# Patient Record
Sex: Female | Born: 1935
Health system: Southern US, Community
[De-identification: ages and names within clinical notes are randomized; demographics above are authoritative.]

---

## 2007-08-01 ENCOUNTER — Inpatient Hospital Stay (HOSPITAL_BASED_OUTPATIENT_CLINIC_OR_DEPARTMENT_OTHER): Admission: RE | Admit: 2007-08-01 | Discharge: 2007-08-01 | Payer: Self-pay | Admitting: Cardiology

## 2008-03-30 ENCOUNTER — Emergency Department (HOSPITAL_COMMUNITY): Admission: EM | Admit: 2008-03-30 | Discharge: 2008-03-30 | Payer: Self-pay | Admitting: Emergency Medicine

## 2010-07-07 NOTE — Cardiovascular Report (Signed)
NAMEGABBY, Marcia Saunders              ACCOUNT NO.:  [id number]   MEDICAL RECORD NO.:  [id number]          PATIENT TYPE:  OIB   LOCATION:  1961                         FACILITY:  [facility]   PHYSICIAN:  Mohan N. Sharyn Lull, M.D. DATE OF BIRTH:  11/12/1930   DATE OF PROCEDURE:  08/01/2007  DATE OF DISCHARGE:                            CARDIAC CATHETERIZATION   PROCEDURE:  Left cardiac catheterization with selective left and right  coronary angiography, LV graphy via right groin using Judkins technique.   INDICATIONS FOR THE PROCEDURE:  Ms. Marton is 77 year old black female  with past medical history significant for hypertension, remote history  of tobacco abuse, complaints of vague retrosternal chest fullness  associated with shortness of breath and feeling weak and dizzy, also  complaints of occasional palpitation and lightheadedness.  No syncopal  episode.  Denies PND, orthopnea, and leg swelling.  EKG done in my  office showed normal sinus rhythm with ST-T wave changes in lateral  leads suggestive of ischemia.  The patient denies any rest or nocturnal  anginal chest pain.  Denies chest pain at present.   PAST MEDICAL HISTORY:  As above.   PAST SURGICAL HISTORY:  She had hysterectomy in the past, had partial  thyroidectomy in the past, had tubal ligation in the past.   SOCIAL HISTORY:  She is widowed, retired, worked in Audiological scientist, smoked less  than 1-pack per week for 20+ years, quit in 1980s, used to drink  socially.   FAMILY HISTORY:  Father died at the age of 49 due to epilepsy.  Mother  died of old age at the age of 76.   ALLERGIES:  No known drug allergies.   MEDICATIONS AT HOME:  1. She is on Norvasc 5 mg p.o. daily.  2. Toprol was recently started 250 mg p.o. daily.  3. Enteric-coated aspirin 81 mg p.o. daily.  4. Prilosec 20 mg p.o. daily.   PHYSICAL EXAMINATION:  She is alert, awake, and oriented x3 in no acute  distress.  Blood pressure is 164/94, pulse was 80 and  regular.  Conjunctivae was pink.  Neck was supple.  No JVD, no bruit.  Lungs were  clear to auscultation without rhonchi or rales.  Cardiovascular exam, S1  and S2 was normal.  There was soft systolic murmur and S4 gallop.  Abdomen was soft.  Bowel sounds were present, nontender.  Extremities  revealed no clubbing, cyanosis, or edema.   IMPRESSION:  1. Chest pain with abnormal EKG, rule out coronary insufficiency.  2. Uncontrolled hypertension.  3. Palpitation rule out cardiac arrhythmias.   I discussed with the patient regarding left cath, its risks and benefits  i.e., death, stroke, local vascular complications.  They accept and  consented for the left cath.   PROCEDURE:  After obtaining the informed consent, the patient was  brought to the cath lab and was placed on fluoroscopy table.  Right  groin was prepped and draped in usual fashion.  Xylocaine 1% was used  for local anesthesia in the right groin.  With the help of thin-walled  needle, 4-French arterial sheath was placed.  Sheath was  aspirated and  flushed.  Next, 4-French left Judkins catheter was advanced over the  wire under fluoroscopic guidance up to the ascending aorta.  Wire was  pulled out.  The catheter was aspirated and connected to the manifold.  Catheter was further advanced and engaged into the left coronary ostium.  Multiple views of the left system were taken.  Next, the catheter was  disengaged and was pulled out over the wire and was placed 4-French  right Judkins catheter, which was advanced over the wire under  fluoroscopic guidance up to the ascending aorta.  Wire was pulled out.  The catheter was aspirated and connected to the manifold.  Catheter was  further advanced and engaged into right coronary ostium.  Multiple views  of the right system were taken.  Next, the catheter was disengaged and  was pulled out over the wire and was replaced with 6-French pigtail  catheter which was advanced over the wire  under fluoroscopic guidance to  the ascending aorta.  Wire was pulled out and the catheter was aspirated  and connected to the manifold.  Catheter was further advanced across the  aortic valve into the LV.  LV pressures were recorded.  Next, LV graphy  was done in 30 degrees RAO position.  Post angiographic pressures were  recorded from LV and then pullback pressures were recorded from the  aorta.  There was no gradient across the aortic valve.  Next, the  pigtail catheter was pulled out over the wire.  Sheaths were aspirated  and flushed.   FINDINGS:  LV showed good LV systolic function.  LVH EF of 55-60%.  Left  main was long which was patent.  LAD has 50-60% napkin ring proximal LAD  stenosis and 20-30% mid sequential stenosis.  Diagonal 1 and 2 were very  small which were patent.  Ramus was small which was patent.  Left  circumflex was large which was patent.  OM-1 and OM-2 were small which  were patent.  RCA was patent.  The patient has codominant coronary  system.  The patient tolerated the procedure well.  There were no  complications.  The patient was transferred to recovery room in stable  condition.      Eduardo Osier. Sharyn Lull, M.D.  Electronically Signed     [facility]/MEDQ  D:  08/01/2007  T:  08/01/2007  Job:  [id number]

## 2018-11-22 ENCOUNTER — Other Ambulatory Visit: Payer: Self-pay

## 2018-11-22 ENCOUNTER — Inpatient Hospital Stay (HOSPITAL_COMMUNITY)
Admission: EM | Admit: 2018-11-22 | Discharge: 2018-12-01 | DRG: 377 | Disposition: A | Discharge status: Skilled Nursing Facility | Payer: Medicare Other | Attending: Internal Medicine | Admitting: Internal Medicine

## 2018-11-22 ENCOUNTER — Emergency Department (HOSPITAL_COMMUNITY): Payer: Medicare Other

## 2018-11-22 ENCOUNTER — Inpatient Hospital Stay (HOSPITAL_COMMUNITY): Payer: Medicare Other

## 2018-11-22 DIAGNOSIS — G4733 Obstructive sleep apnea (adult) (pediatric): Secondary | ICD-10-CM

## 2018-11-22 DIAGNOSIS — N2581 Secondary hyperparathyroidism of renal origin: Secondary | ICD-10-CM | POA: Diagnosis present

## 2018-11-22 DIAGNOSIS — D62 Acute posthemorrhagic anemia: Secondary | ICD-10-CM

## 2018-11-22 DIAGNOSIS — E871 Hypo-osmolality and hyponatremia: Secondary | ICD-10-CM | POA: Diagnosis present

## 2018-11-22 DIAGNOSIS — Z6841 Body Mass Index (BMI) 40.0 and over, adult: Secondary | ICD-10-CM

## 2018-11-22 DIAGNOSIS — D649 Anemia, unspecified: Secondary | ICD-10-CM | POA: Insufficient documentation

## 2018-11-22 DIAGNOSIS — E872 Acidosis: Secondary | ICD-10-CM | POA: Diagnosis present

## 2018-11-22 DIAGNOSIS — N179 Acute kidney failure, unspecified: Secondary | ICD-10-CM

## 2018-11-22 DIAGNOSIS — I5082 Biventricular heart failure: Secondary | ICD-10-CM | POA: Diagnosis present

## 2018-11-22 DIAGNOSIS — I2510 Atherosclerotic heart disease of native coronary artery without angina pectoris: Secondary | ICD-10-CM | POA: Diagnosis present

## 2018-11-22 DIAGNOSIS — K921 Melena: Secondary | ICD-10-CM | POA: Diagnosis not present

## 2018-11-22 DIAGNOSIS — I480 Paroxysmal atrial fibrillation: Secondary | ICD-10-CM

## 2018-11-22 DIAGNOSIS — Z20828 Contact with and (suspected) exposure to other viral communicable diseases: Secondary | ICD-10-CM | POA: Diagnosis not present

## 2018-11-22 DIAGNOSIS — I361 Nonrheumatic tricuspid (valve) insufficiency: Secondary | ICD-10-CM | POA: Diagnosis not present

## 2018-11-22 DIAGNOSIS — I50811 Acute right heart failure: Secondary | ICD-10-CM | POA: Diagnosis not present

## 2018-11-22 DIAGNOSIS — I21A1 Myocardial infarction type 2: Secondary | ICD-10-CM | POA: Diagnosis not present

## 2018-11-22 DIAGNOSIS — I219 Acute myocardial infarction, unspecified: Secondary | ICD-10-CM | POA: Diagnosis not present

## 2018-11-22 DIAGNOSIS — R571 Hypovolemic shock: Secondary | ICD-10-CM | POA: Diagnosis present

## 2018-11-22 DIAGNOSIS — R5381 Other malaise: Secondary | ICD-10-CM | POA: Diagnosis not present

## 2018-11-22 DIAGNOSIS — D72829 Elevated white blood cell count, unspecified: Secondary | ICD-10-CM | POA: Diagnosis present

## 2018-11-22 DIAGNOSIS — Z87891 Personal history of nicotine dependence: Secondary | ICD-10-CM

## 2018-11-22 DIAGNOSIS — D689 Coagulation defect, unspecified: Secondary | ICD-10-CM | POA: Diagnosis present

## 2018-11-22 DIAGNOSIS — N170 Acute kidney failure with tubular necrosis: Secondary | ICD-10-CM | POA: Diagnosis not present

## 2018-11-22 DIAGNOSIS — Z79899 Other long term (current) drug therapy: Secondary | ICD-10-CM

## 2018-11-22 DIAGNOSIS — I130 Hypertensive heart and chronic kidney disease with heart failure and stage 1 through stage 4 chronic kidney disease, or unspecified chronic kidney disease: Secondary | ICD-10-CM | POA: Diagnosis present

## 2018-11-22 DIAGNOSIS — R601 Generalized edema: Secondary | ICD-10-CM

## 2018-11-22 DIAGNOSIS — Z515 Encounter for palliative care: Secondary | ICD-10-CM

## 2018-11-22 DIAGNOSIS — I071 Rheumatic tricuspid insufficiency: Secondary | ICD-10-CM | POA: Diagnosis not present

## 2018-11-22 DIAGNOSIS — F0390 Unspecified dementia without behavioral disturbance: Secondary | ICD-10-CM | POA: Diagnosis present

## 2018-11-22 DIAGNOSIS — R778 Other specified abnormalities of plasma proteins: Secondary | ICD-10-CM | POA: Diagnosis not present

## 2018-11-22 DIAGNOSIS — G4710 Hypersomnia, unspecified: Secondary | ICD-10-CM | POA: Diagnosis present

## 2018-11-22 DIAGNOSIS — R578 Other shock: Secondary | ICD-10-CM | POA: Diagnosis present

## 2018-11-22 DIAGNOSIS — Z781 Physical restraint status: Secondary | ICD-10-CM

## 2018-11-22 DIAGNOSIS — E8809 Other disorders of plasma-protein metabolism, not elsewhere classified: Secondary | ICD-10-CM | POA: Diagnosis present

## 2018-11-22 DIAGNOSIS — Z90710 Acquired absence of both cervix and uterus: Secondary | ICD-10-CM

## 2018-11-22 DIAGNOSIS — R0602 Shortness of breath: Secondary | ICD-10-CM

## 2018-11-22 DIAGNOSIS — J90 Pleural effusion, not elsewhere classified: Secondary | ICD-10-CM

## 2018-11-22 DIAGNOSIS — I50810 Right heart failure, unspecified: Secondary | ICD-10-CM | POA: Diagnosis not present

## 2018-11-22 DIAGNOSIS — J9621 Acute and chronic respiratory failure with hypoxia: Secondary | ICD-10-CM | POA: Diagnosis not present

## 2018-11-22 DIAGNOSIS — I469 Cardiac arrest, cause unspecified: Secondary | ICD-10-CM | POA: Diagnosis present

## 2018-11-22 DIAGNOSIS — I468 Cardiac arrest due to other underlying condition: Secondary | ICD-10-CM | POA: Diagnosis present

## 2018-11-22 DIAGNOSIS — G9341 Metabolic encephalopathy: Secondary | ICD-10-CM | POA: Diagnosis not present

## 2018-11-22 DIAGNOSIS — J9602 Acute respiratory failure with hypercapnia: Secondary | ICD-10-CM | POA: Diagnosis not present

## 2018-11-22 DIAGNOSIS — N183 Chronic kidney disease, stage 3 unspecified: Secondary | ICD-10-CM | POA: Diagnosis present

## 2018-11-22 DIAGNOSIS — I50813 Acute on chronic right heart failure: Secondary | ICD-10-CM | POA: Diagnosis not present

## 2018-11-22 DIAGNOSIS — J9601 Acute respiratory failure with hypoxia: Secondary | ICD-10-CM | POA: Diagnosis present

## 2018-11-22 DIAGNOSIS — R0902 Hypoxemia: Secondary | ICD-10-CM

## 2018-11-22 DIAGNOSIS — K922 Gastrointestinal hemorrhage, unspecified: Secondary | ICD-10-CM

## 2018-11-22 DIAGNOSIS — I5031 Acute diastolic (congestive) heart failure: Secondary | ICD-10-CM | POA: Diagnosis not present

## 2018-11-22 DIAGNOSIS — E662 Morbid (severe) obesity with alveolar hypoventilation: Secondary | ICD-10-CM | POA: Diagnosis present

## 2018-11-22 DIAGNOSIS — I2781 Cor pulmonale (chronic): Secondary | ICD-10-CM | POA: Diagnosis not present

## 2018-11-22 DIAGNOSIS — R0689 Other abnormalities of breathing: Secondary | ICD-10-CM | POA: Diagnosis not present

## 2018-11-22 DIAGNOSIS — Z452 Encounter for adjustment and management of vascular access device: Secondary | ICD-10-CM

## 2018-11-22 DIAGNOSIS — G9340 Encephalopathy, unspecified: Secondary | ICD-10-CM | POA: Diagnosis not present

## 2018-11-22 LAB — CBC WITH DIFFERENTIAL/PLATELET
Abs Immature Granulocytes: 0.56 10*3/uL — ABNORMAL HIGH (ref 0.00–0.07)
Basophils Absolute: 0 10*3/uL (ref 0.0–0.1)
Basophils Relative: 0 %
Eosinophils Absolute: 0 10*3/uL (ref 0.0–0.5)
Eosinophils Relative: 0 %
HCT: 12.8 % — ABNORMAL LOW (ref 36.0–46.0)
Hemoglobin: 4 g/dL — CL (ref 12.0–15.0)
Immature Granulocytes: 4 %
Lymphocytes Relative: 11 %
Lymphs Abs: 1.7 10*3/uL (ref 0.7–4.0)
MCH: 25 pg — ABNORMAL LOW (ref 26.0–34.0)
MCHC: 31.3 g/dL (ref 30.0–36.0)
MCV: 80 fL (ref 80.0–100.0)
Monocytes Absolute: 1.5 10*3/uL — ABNORMAL HIGH (ref 0.1–1.0)
Monocytes Relative: 10 %
Neutro Abs: 11.2 10*3/uL — ABNORMAL HIGH (ref 1.7–7.7)
Neutrophils Relative %: 75 %
Platelets: 144 10*3/uL — ABNORMAL LOW (ref 150–400)
RBC: 1.6 MIL/uL — ABNORMAL LOW (ref 3.87–5.11)
RDW: 19.7 % — ABNORMAL HIGH (ref 11.5–15.5)
WBC: 14.9 10*3/uL — ABNORMAL HIGH (ref 4.0–10.5)
nRBC: 0.4 % — ABNORMAL HIGH (ref 0.0–0.2)

## 2018-11-22 LAB — COMPREHENSIVE METABOLIC PANEL
ALT: 77 U/L — ABNORMAL HIGH (ref 0–44)
AST: 151 U/L — ABNORMAL HIGH (ref 15–41)
Albumin: 1.3 g/dL — ABNORMAL LOW (ref 3.5–5.0)
Alkaline Phosphatase: 59 U/L (ref 38–126)
Anion gap: 18 — ABNORMAL HIGH (ref 5–15)
BUN: 27 mg/dL — ABNORMAL HIGH (ref 8–23)
CO2: 17 mmol/L — ABNORMAL LOW (ref 22–32)
Calcium: 7.3 mg/dL — ABNORMAL LOW (ref 8.9–10.3)
Chloride: 97 mmol/L — ABNORMAL LOW (ref 98–111)
Creatinine, Ser: 1.14 mg/dL — ABNORMAL HIGH (ref 0.44–1.00)
GFR calc Af Amer: 50 mL/min — ABNORMAL LOW (ref >60)
GFR calc non Af Amer: 44 mL/min — ABNORMAL LOW (ref >60)
Glucose, Bld: 93 mg/dL (ref 70–99)
Potassium: 4.8 mmol/L (ref 3.5–5.1)
Sodium: 132 mmol/L — ABNORMAL LOW (ref 135–145)
Total Bilirubin: 0.6 mg/dL (ref 0.3–1.2)
Total Protein: 3.3 g/dL — ABNORMAL LOW (ref 6.5–8.1)

## 2018-11-22 LAB — CBG MONITORING, ED: Glucose-Capillary: 91 mg/dL (ref 70–99)

## 2018-11-22 LAB — MASSIVE TRANSFUSION PROTOCOL ORDER (BLOOD BANK NOTIFICATION)

## 2018-11-22 LAB — ABO/RH: ABO/RH(D): A POS

## 2018-11-22 LAB — PROTIME-INR
INR: 2.3 — ABNORMAL HIGH (ref 0.8–1.2)
Prothrombin Time: 24.6 seconds — ABNORMAL HIGH (ref 11.4–15.2)

## 2018-11-22 LAB — BRAIN NATRIURETIC PEPTIDE: B Natriuretic Peptide: 739.2 pg/mL — ABNORMAL HIGH (ref 0.0–100.0)

## 2018-11-22 LAB — LACTIC ACID, PLASMA: Lactic Acid, Venous: >11 mmol/L (ref 0.5–1.9)

## 2018-11-22 LAB — SARS CORONAVIRUS 2 BY RT PCR (HOSPITAL ORDER, PERFORMED IN ~~LOC~~ HOSPITAL LAB): SARS Coronavirus 2: NEGATIVE

## 2018-11-22 LAB — POC OCCULT BLOOD, ED: Fecal Occult Bld: POSITIVE — AB

## 2018-11-22 MED ORDER — FENTANYL CITRATE (PF) 100 MCG/2ML IJ SOLN
25.0000 ug | INTRAMUSCULAR | Status: DC | PRN
  Administered 2018-11-22 – 2018-11-23 (×2): 100 ug via INTRAVENOUS
  Administered 2018-11-23 (×2): 50 ug via INTRAVENOUS
  Filled 2018-11-22 (×4): qty 2

## 2018-11-22 MED ORDER — EPINEPHRINE 1 MG/10ML IJ SOSY
PREFILLED_SYRINGE | INTRAMUSCULAR | Status: AC | PRN
  Administered 2018-11-22: 0.5 mg via INTRAVENOUS

## 2018-11-22 MED ORDER — SODIUM CHLORIDE 0.9 % IV SOLN
8.0000 mg/h | INTRAVENOUS | Status: DC
  Administered 2018-11-23 (×2): 8 mg/h via INTRAVENOUS
  Filled 2018-11-22 (×4): qty 80

## 2018-11-22 MED ORDER — CHLORHEXIDINE GLUCONATE 0.12% ORAL RINSE (MEDLINE KIT)
15.0000 mL | Freq: Two times a day (BID) | OROMUCOSAL | Status: DC
  Administered 2018-11-22 – 2018-11-24 (×5): 15 mL via OROMUCOSAL

## 2018-11-22 MED ORDER — SODIUM CHLORIDE 0.9 % IV SOLN
80.0000 mg | Freq: Once | INTRAVENOUS | Status: DC
  Filled 2018-11-22 (×2): qty 80

## 2018-11-22 MED ORDER — CHLORHEXIDINE GLUCONATE CLOTH 2 % EX PADS
6.0000 | MEDICATED_PAD | Freq: Every day | CUTANEOUS | Status: DC
  Administered 2018-11-25 – 2018-12-01 (×6): 6 via TOPICAL

## 2018-11-22 MED ORDER — FENTANYL CITRATE (PF) 100 MCG/2ML IJ SOLN: 25.0000 ug | INTRAMUSCULAR | Status: DC | PRN

## 2018-11-22 MED ORDER — SODIUM CHLORIDE 0.9 % IV SOLN: INTRAVENOUS | Status: DC | PRN

## 2018-11-22 MED ORDER — SODIUM CHLORIDE 0.9 % IV SOLN: 1.0000 g | Freq: Once | INTRAVENOUS | Status: DC

## 2018-11-22 MED ORDER — SODIUM CHLORIDE 0.9% IV SOLUTION
Freq: Once | INTRAVENOUS | Status: AC
  Administered 2018-11-22: 20:00:00 via INTRAVENOUS

## 2018-11-22 MED ORDER — EPINEPHRINE HCL 5 MG/250ML IV SOLN IN NS
0.5000 ug/min | INTRAVENOUS | Status: DC
  Administered 2018-11-22: 10 ug/min via INTRAVENOUS
  Filled 2018-11-22 (×2): qty 250

## 2018-11-22 MED ORDER — PANTOPRAZOLE SODIUM 40 MG IV SOLR: 40.0000 mg | Freq: Every day | INTRAVENOUS | Status: DC

## 2018-11-22 MED ORDER — ETOMIDATE 2 MG/ML IV SOLN
INTRAVENOUS | Status: AC | PRN
  Administered 2018-11-22: 20 mg via INTRAVENOUS

## 2018-11-22 MED ORDER — FENTANYL CITRATE (PF) 100 MCG/2ML IJ SOLN
INTRAMUSCULAR | Status: AC
  Administered 2018-11-22: 50 ug
  Filled 2018-11-22: qty 2

## 2018-11-22 MED ORDER — PANTOPRAZOLE SODIUM 40 MG IV SOLR: 40.0000 mg | Freq: Two times a day (BID) | INTRAVENOUS | Status: DC

## 2018-11-22 MED ORDER — SODIUM CHLORIDE 0.9 % IV SOLN: INTRAVENOUS | Status: DC

## 2018-11-22 MED ORDER — KETAMINE HCL 50 MG/5ML IJ SOSY
1.0000 mg/kg | PREFILLED_SYRINGE | Freq: Once | INTRAMUSCULAR | Status: AC
  Administered 2018-11-22: 17:00:00 100 mg via INTRAVENOUS
  Filled 2018-11-22 (×2): qty 15

## 2018-11-22 MED ORDER — ORAL CARE MOUTH RINSE
15.0000 mL | OROMUCOSAL | Status: DC
  Administered 2018-11-23 – 2018-11-25 (×28): 15 mL via OROMUCOSAL

## 2018-11-22 MED ORDER — NOREPINEPHRINE 4 MG/250ML-% IV SOLN
0.0000 ug/min | INTRAVENOUS | Status: DC
  Administered 2018-11-22: 17:00:00 5 ug/min via INTRAVENOUS
  Administered 2018-11-22: 17:00:00 10 ug/min via INTRAVENOUS
  Administered 2018-11-23: 2 ug/min via INTRAVENOUS
  Administered 2018-11-23: 5 ug/min via INTRAVENOUS
  Filled 2018-11-22 (×2): qty 250

## 2018-11-22 MED ORDER — MIDAZOLAM HCL 2 MG/2ML IJ SOLN
1.0000 mg | INTRAMUSCULAR | Status: AC | PRN
  Administered 2018-11-23 (×3): 1 mg via INTRAVENOUS
  Filled 2018-11-22 (×2): qty 2

## 2018-11-22 MED ORDER — MIDAZOLAM HCL 2 MG/2ML IJ SOLN
1.0000 mg | INTRAMUSCULAR | Status: DC | PRN
  Administered 2018-11-23: 01:00:00 1 mg via INTRAVENOUS
  Filled 2018-11-22: qty 2

## 2018-11-22 NOTE — Procedures (Signed)
Arterial Catheter Insertion Procedure Note Marcia Saunders [id number] 29-Jun-1930  Procedure: Insertion of Arterial Catheter  Indications: Blood pressure monitoring and Frequent blood sampling  Procedure Details Consent: Risks of procedure as well as the alternatives and risks of each were explained to the (patient/caregiver).  Consent for procedure obtained. Time Out: Verified patient identification, verified procedure, site/side was marked, verified correct patient position, special equipment/implants available, medications/allergies/relevent history reviewed, required imaging and test results available.  Performed  Maximum sterile technique was used including antiseptics, cap, gloves, gown, hand hygiene, mask and sheet. Skin prep: Chlorhexidine; 20 gauge catheter was inserted into left radial artery using the Seldinger technique.  Biopatch and sterile dressing applied.   ULTRASOUND GUIDANCE USED: YES Evaluation Blood flow good; BP tracing positional . Complications: No apparent complications.  Kennieth Rad, MSN, AGACNP-BC [facility] Pgr: [phone] or if no answer [phone] 11/22/2018, 8:08 PM

## 2018-11-22 NOTE — H&P (Addendum)
NAME:  Marcia Saunders, MRN:  [id number], DOB:  [birth year], LOS: 0 ADMISSION DATE:  11/22/2018, CONSULTATION DATE:  11/22/18 REFERRING MD:  Maryan Rued  CHIEF COMPLAINT:  Cardiac Arrest   Brief History   Marcia Saunders is a 85 y.o. female who was admitted 9/30 after cardiac arrest presumed due to hemorrhagic shock from GI bleed of unclear etiology.  History of present illness   Pt is encephelopathic; therefore, this HPI is obtained from chart review. Marcia Saunders is a 85 y.o. female who has unknown PMH as she has not seen a healthcare provider in several years.  She presented to [facility] ED 9/30 after cardiac arrest.  She apparently had not been feeling well for around a month and over the past week or so, began to experience dyspnea, anasarca, weakness.  She also began to experience blood tinged stools starting around 9/28.  On 9/30, she went to use the bathroom and daughter later found her unresponsive on the commode.  Fire was called and upon their arrival, she did have a pulse; however, while on scene, she lost pulses.  She required 2 rounds epi before ROSC.  Upon arrival to ED, she empirically received 2u PRBC's given her appearance and concern for hemorrhagic shock.  She briefly lost pulses again but never required CPR.  She was however started on levophed and epinephrine infusions given persistent hypotension.  Labs from ED were noteable for Hgb of 4 and she received an additional 2u PRBC with 2u FFP ordered.  Per daughter, she did not believe that pt would have wanted to be intubated or resuscitated; however, after consulting with her brothers who are out of state, they have decided to continue current efforts while they make their way to the hospital.  GI has been consulted; however, given her hemodynamic instability, they have opted to defer any invasive procedures for now and continue with supportive care.  If she were to stabilize overnight, EGD / colonoscopy can be considered 10/1.  Past  Medical History  Unknown.  Significant Hospital Events   9/30 > admit.  Consults:  GI.  Procedures:  ETT 9/30 >  CVL pending 9/30 >   Significant Diagnostic Tests:  CXR 9/30 > atelectasis. Echo 10/1 >   Micro Data:  SARS CoV2 9/30 >   Antimicrobials:  Ceftriaxone 9/30 x 1.   Interim history/subjective:  Unresponsive.  Objective:  Blood pressure (!) 141/83, pulse 87, resp. rate (!) 21, height 5\' 6"  (1.676 m), weight 117.9 kg, SpO2 91 %.    Vent Mode: PRVC FiO2 (%):  [100 %] 100 % Set Rate:  [18 bmp] 18 bmp Vt Set:  [470 mL] 470 mL PEEP:  [5 cmH20] 5 cmH20   Intake/Output Summary (Last 24 hours) at 11/22/2018 1831 Last data filed at 11/22/2018 1709 Gross per 24 hour  Intake 9.06 ml  Output -  Net 9.06 ml   Filed Weights   11/22/18 1632  Weight: 117.9 kg    Examination: General: Elderly female, critically ill. Neuro: Unresponsive. HEENT: [place]/AT. Sclerae anicteric.  ETT in place.  Coffee ground emesis in OGT. Cardiovascular: RRR, no M/R/G. + anasarca. Lungs: Respirations even and unlabored.  Diminished bilaterally. Abdomen: BS x 4, soft, NT/ND.  Musculoskeletal: No gross deformities, 2+ edema.  Skin: Intact, warm, no rashes.  Assessment & Plan:   Cardiac arrest - presumed due to hemorrhagic shock in setting GI bleed, unclear etiology.  She is s/p 4u PRBC in ED and 2u FFP currently transfusing. -  No TTM given bleeding. - F/u H/H now and again at midnight. - GI consulted, will plan to see in AM 10/1 and if stabilized, will then consider EGD / colonoscopy. - Continue epinephrine and norepinephrine infusions, wean as able for goal MAP > 65. - Assess CVP's, goal > 8. - Continue PPI gtt. - Assess echo, suspect some degree of underlying CHF. - Trend troponins.  Coagulopathy (INR 2.3) - unclear etiology, ? Underlying liver dysfunction given mild bump in transaminases.  S/p 2u FFP. - Follow coags.  Respiratory insufficiency - due to inability to protect the  airway in the setting of above. - Full vent support. - Assess ABG. - Wean as able. - Bronchial hygiene. - Follow CXR.  AKI. Hyponatremia. AGMA - lactate 2/2 above. - NS @ 100. - Trend lactate. - Follow BMP.   Best Practice:  Diet: NPO. Pain/Anxiety/Delirium protocol (if indicated): Fentanyl PRN / Midazolam PRN.  RASS goal 0. VAP protocol (if indicated): In place. DVT prophylaxis: SCD's only. GI prophylaxis: PPI gtt. Glucose control: SSI if glucose consistently > 180. Mobility: Bedrest. Code Status: Full - 2 sons on way to hospital to discuss further.  Remains full code in the interim. Family Communication: Daughter updated at bedside (Yudith [phone]). Disposition: ICU.  Labs   CBC: Recent Labs  Lab 11/22/18 1640  WBC 14.9*  NEUTROABS 11.2*  HGB 4.0*  HCT 12.8*  MCV 80.0  PLT 144*   Basic Metabolic Panel: Recent Labs  Lab 11/22/18 1640  NA 132*  K 4.8  CL 97*  CO2 17*  GLUCOSE 93  BUN 27*  CREATININE 1.14*  CALCIUM 7.3*   GFR: Estimated Creatinine Clearance: 46.2 mL/min (A) (by C-G formula based on SCr of 1.14 mg/dL (H)). Recent Labs  Lab 11/22/18 1640  WBC 14.9*  LATICACIDVEN >11.0*   Liver Function Tests: Recent Labs  Lab 11/22/18 1640  AST 151*  ALT 77*  ALKPHOS 59  BILITOT 0.6  PROT 3.3*  ALBUMIN 1.3*   No results for input(s): LIPASE, AMYLASE in the last 168 hours. No results for input(s): AMMONIA in the last 168 hours. ABG No results found for: PHART, PCO2ART, PO2ART, HCO3, TCO2, ACIDBASEDEF, O2SAT  Coagulation Profile: Recent Labs  Lab 11/22/18 1640  INR 2.3*   Cardiac Enzymes: No results for input(s): CKTOTAL, CKMB, CKMBINDEX, TROPONINI in the last 168 hours. HbA1C: No results found for: HGBA1C CBG: Recent Labs  Lab 11/22/18 1629  GLUCAP 91    Review of Systems:   Unable to obtain as pt is encephalopathic.  Past medical history  She,  has no past medical history on file.   Surgical History   Unknown.   Social History      Family history   Her family history is not on file.   Allergies Not on File   Home meds  Prior to Admission medications   Not on File    Critical care time: 60 min.    Rutherford Guys, PA [facility] Pager: [phone].  If no answer, [phone] 11/22/2018, 6:31 PM

## 2018-11-22 NOTE — Progress Notes (Signed)
Received  patient from ED. RT unable to zero arterial line draw off line undressed art line it was kinked removed it and held pressure.

## 2018-11-22 NOTE — Progress Notes (Addendum)
[facility] Progress Note Patient Name: Marcia Saunders DOB: [birth year] MRN: [id number]   Date of Service  11/22/2018  HPI/Events of Note  Pt admitted through the ED with cardiac arrest secondary to hemorrhagic shock, she received massive blood transfusion protocol and hemodynamics have stabilized, she is intubated on the ventilator. Post-transfusion hemoglobin is pending.  Pt needs soft wrist restraints.  eICU Interventions  New patient evaluation completed. Will follow up pending labs and Rx accordingly. Restraints ordered.        [name] 11/22/2018, 9:25 PM

## 2018-11-22 NOTE — Procedures (Signed)
Central Venous Catheter Insertion Procedure Note Marcia Saunders [id number] 12-Jun-1930  Procedure: Insertion of Central Venous Catheter Indications: Assessment of intravascular volume and Drug and/or fluid administration  Procedure Details Consent: Risks of procedure as well as the alternatives and risks of each were explained to the (patient/caregiver).  Consent for procedure obtained. Time Out: Verified patient identification, verified procedure, site/side was marked, verified correct patient position, special equipment/implants available, medications/allergies/relevent history reviewed, required imaging and test results available.  Performed  Maximum sterile technique was used including antiseptics, cap, gloves, gown, hand hygiene, mask and sheet. Skin prep: Chlorhexidine; local anesthetic administered A antimicrobial bonded/coated triple lumen catheter was placed in the left internal jugular vein using the Seldinger technique.  Evaluation Blood flow good Complications: No apparent complications Patient did tolerate procedure well. Chest X-ray ordered to verify placement.  CXR: pending.  Otilio Carpen Marcia Saunders 11/22/2018, 8:04 PM

## 2018-11-22 NOTE — Progress Notes (Signed)
[facility] Progress Note Patient Name: GELISA TIEKEN DOB: 19-Jan-1931 MRN: [id number]   Date of Service  11/22/2018  HPI/Events of Note  RN requests check of CXR for central line placement.  eICU Interventions  Central line in good position, no pneumothorax, ETT needs to be pulled back 3 cm.        Kerry Kass Ogan 11/22/2018, 11:30 PM

## 2018-11-22 NOTE — Plan of Care (Signed)
Case discussed with Karen Kays, PA-C, of PCCM as well as Dr. Maryan Rued of ED.  Patient critically ill, unstable, with cardiac arrest in setting of GI bleeding.  Hgb 4.  Hypotensive on pressors.  She had minimal signs of responsiveness based on ED note.  OGT with coffee grounds and non bright red material.  Suggest PPI, blood transfusion, and volume resuscitation via PCCM overnight.  She is in no shape for endoscopy at this point.  We will reassess in AM.

## 2018-11-22 NOTE — Progress Notes (Signed)
Aline insertion attempted X2. RT unable to thread catheter. Per MD we are going to stop all invasive measures per the family at this time.

## 2018-11-22 NOTE — ED Triage Notes (Signed)
Pt arrives via EMS from home with reports of feeling sick for a month, and family found pt unresponsiveness on toilet. Family reports pt has been throwing up blood today. Pt lost pulses and fire started CPR. 2 rounds of epi given, king airway

## 2018-11-22 NOTE — Progress Notes (Signed)
Transported to [place] with no complications.

## 2018-11-22 NOTE — ED Notes (Signed)
Consent for central line obtained 

## 2018-11-22 NOTE — ED Provider Notes (Signed)
[facility] DEPART[facility] Provider Note   CSN: [id number] Arrival date & time:        History   Chief Complaint Chief Complaint  Patient presents with   post cpr    HPI MAKAYLIA HEWETT is a 85 y.o. female.     Patient is an 85 year old female who does not see a doctor regularly and unknown medical history presenting today after a cardiac arrest.  Per patient's daughter who lives with the patient for the last month she has been retaining fluid and becoming weaker and weaker.  In the last 1 week she has not been eating or drinking much and was requiring help to do almost everything.  For the last 2 days she has had bloody stools.  Daughter states earlier this month she was having some shortness of breath but that seemed to be better this week.  She has not had cough, fever or congestion.  The patient's daughter helped her to the bathroom today while she was sitting on the toilet she had a syncopal event.  They called 911.  When fire arrived patient had a palpable pulse and was breathing and then quickly lost her pulse and they immediately started CPR.  When paramedics arrived patient received 2 rounds of epi with return of spontaneous circulation and a King airway was placed.  Patient's blood sugar was greater than 100.  Patient does not take anticoagulation per the daughter.  No history of renal disease.  The history is provided by the EMS personnel and a relative.    No past medical history on file.  There are no active problems to display for this patient.      OB History   No obstetric history on file.      Home Medications    Prior to Admission medications   Not on File    Family History No family history on file.  Social History Social History   Tobacco Use   Smoking status: Not on file  Substance Use Topics   Alcohol use: Not on file   Drug use: Not on file     Allergies   Patient has no allergy information on  record.   Review of Systems Review of Systems  Unable to perform ROS: Intubated  All other systems reviewed and are negative.    Physical Exam Updated Vital Signs BP (!) 141/83    Pulse 87    Resp (!) 21    Wt 117.9 kg    SpO2 91%   Physical Exam Vitals signs and nursing note reviewed.  Constitutional:      General: She is in acute distress.     Appearance: She is well-developed and overweight.     Interventions: She is intubated.     Comments: Mildly gagging with the tube but no other meaningful response  HENT:     Head: Normocephalic and atraumatic.     Mouth/Throat:     Mouth: Mucous membranes are dry.  Eyes:     Comments: Pupils are 4 mm and sluggishly reactive.  Pale conjunctival and oral mucosa  Neck:     Musculoskeletal: Neck supple.  Cardiovascular:     Rate and Rhythm: Regular rhythm. Tachycardia present.     Heart sounds: Normal heart sounds. No murmur. No friction rub.     Comments: Thready pulses in bilateral radius Pulmonary:     Effort: Pulmonary effort is normal. She is intubated.     Breath sounds: Normal  breath sounds. No wheezing or rales.  Abdominal:     General: Bowel sounds are normal. There is distension.     Palpations: Abdomen is soft.     Tenderness: There is no abdominal tenderness. There is no guarding or rebound.     Comments: Anasarca present throughout the body including the abdomen, arms and lower extremities  Genitourinary:    Rectum: Guaiac result positive.     Comments: Bloodstained stool on Hemoccult Musculoskeletal: Normal range of motion.        General: No tenderness.     Right lower leg: Edema present.     Left lower leg: Edema present.     Comments: No edema  Skin:    General: Skin is warm and dry.     Coloration: Skin is pale.     Findings: No rash.  Neurological:     Mental Status: She is unresponsive.     Comments: Patient is gagging on the ET tube but otherwise no significant meaningful response      ED Treatments  / Results  Labs (all labs ordered are listed, but only abnormal results are displayed) Labs Reviewed  CBC WITH DIFFERENTIAL/PLATELET - Abnormal; Notable for the following components:      Result Value   WBC 14.9 (*)    RBC 1.60 (*)    Hemoglobin 4.0 (*)    HCT 12.8 (*)    MCH 25.0 (*)    RDW 19.7 (*)    Platelets 144 (*)    nRBC 0.4 (*)    Neutro Abs 11.2 (*)    Monocytes Absolute 1.5 (*)    Abs Immature Granulocytes 0.56 (*)    All other components within normal limits  COMPREHENSIVE METABOLIC PANEL - Abnormal; Notable for the following components:   Sodium 132 (*)    Chloride 97 (*)    CO2 17 (*)    BUN 27 (*)    Creatinine, Ser 1.14 (*)    Calcium 7.3 (*)    Total Protein 3.3 (*)    Albumin 1.3 (*)    AST 151 (*)    ALT 77 (*)    GFR calc non Af Amer 44 (*)    GFR calc Af Amer 50 (*)    Anion gap 18 (*)    All other components within normal limits  BRAIN NATRIURETIC PEPTIDE - Abnormal; Notable for the following components:   B Natriuretic Peptide 739.2 (*)    All other components within normal limits  PROTIME-INR - Abnormal; Notable for the following components:   Prothrombin Time 24.6 (*)    INR 2.3 (*)    All other components within normal limits  LACTIC ACID, PLASMA - Abnormal; Notable for the following components:   Lactic Acid, Venous >11.0 (*)    All other components within normal limits  POC OCCULT BLOOD, ED - Abnormal; Notable for the following components:   Fecal Occult Bld POSITIVE (*)    All other components within normal limits  CULTURE, BLOOD (ROUTINE X 2)  CULTURE, BLOOD (ROUTINE X 2)  SARS CORONAVIRUS 2 (HOSPITAL ORDER, PERFORMED IN [facility])  DIC (DISSEMINATED INTRAVASCULAR COAGULATION) PANEL  HEMOGLOBIN AND HEMATOCRIT, BLOOD  CBG MONITORING, ED  TYPE AND SCREEN  ABO/RH  MASSIVE TRANSFUSION PROTOCOL ORDER (BLOOD BANK NOTIFICATION)  PREPARE FRESH FROZEN PLASMA  PREPARE PLATELET PHERESIS  PREPARE CRYOPRECIPITATE     EKG None  Radiology Dg Chest Portable 1 View  Result Date: 11/22/2018 CLINICAL DATA:  Acute  cardiac arrest. Intubation. EXAM: PORTABLE CHEST 1 VIEW COMPARISON:  None. FINDINGS: Patient is rotated to the left. Endotracheal tube and nasogastric tube are in appropriate position. Low lung volumes are noted. Atelectasis or infiltrate is seen in the left retrocardiac lung base. Right lung remains clear. No pneumothorax visualized. IMPRESSION: Low lung volumes, with atelectasis or infiltrate in left retrocardiac lung base. Endotracheal tube and nasogastric tube in appropriate position. Electronically Signed   By: Danae OrleansJohn A Stahl M.D.   On: 11/22/2018 17:01    Procedures Procedure Name: Intubation Date/Time: 11/22/2018 6:29 PM Performed by: Gwyneth SproutPlunkett, Zyire Eidson, MD Pre-anesthesia Checklist: Patient identified, Suction available, Patient being monitored and Timeout performed Oxygen Delivery Method: Ambu bag Preoxygenation: Pre-oxygenation with 100% oxygen Induction Type: Rapid sequence Ventilation: Oral airway inserted - appropriate to patient size Laryngoscope Size: Glidescope and 3 Grade View: Grade I Tube size: 7.5 mm Number of attempts: 1 Airway Equipment and Method: Patient positioned with wedge pillow and Video-laryngoscopy Placement Confirmation: ETT inserted through vocal cords under direct vision,  Positive ETCO2 and Breath sounds checked- equal and bilateral Secured at: 21 cm Tube secured with: ETT holder Dental Injury: Teeth and Oropharynx as per pre-operative assessment  Difficulty Due To: Difficulty was unanticipated      (including critical care time)  Medications Ordered in ED Medications  pantoprazole (PROTONIX) 80 mg in sodium chloride 0.9 % 100 mL IVPB (has no administration in time range)  pantoprazole (PROTONIX) 80 mg in sodium chloride 0.9 % 250 mL (0.32 mg/mL) infusion (has no administration in time range)  pantoprazole (PROTONIX) injection 40 mg (has no  administration in time range)  norepinephrine (LEVOPHED) 4mg  in 250mL premix infusion (10 mcg/min Intravenous New Bag/Given 11/22/18 1719)  0.9 %  sodium chloride infusion (has no administration in time range)  EPINEPHrine (ADRENALIN) 4 mg in NS 250 mL (0.016 mg/mL) premix infusion (30 mcg/min Intravenous Rate/Dose Change 11/22/18 1719)  cefTRIAXone (ROCEPHIN) 1 g in sodium chloride 0.9 % 100 mL IVPB (has no administration in time range)  etomidate (AMIDATE) injection (20 mg Intravenous Given 11/22/18 1628)  EPINEPHrine (ADRENALIN) 1 MG/10ML injection (0.5 mg Intravenous Given 11/22/18 1640)  ketamine 50 mg in normal saline 5 mL (10 mg/mL) syringe (100 mg Intravenous Given 11/22/18 1656)     Initial Impression / Assessment and Plan / ED Course  I have reviewed the triage vital signs and the nursing notes.  Pertinent labs & imaging results that were available during my care of the patient were reviewed by me and considered in my medical decision making (see chart for details).        Patient is an 85 year old female being brought in after post arrest.  Patient had return of spontaneous circulation upon arrival heart rate was in the 100s and blood pressure was below 100 systolic.  Patient is diffusely pale with heme positive stools and coffee-ground gastric secretions.  Patient was intubated due to mental status and not protecting her airway.  Intubation as above.  Patient had an OG tube placed.  Given patient's severe paleness and concern for anemia as well as positive stools she was given 2 units of O- blood due to severe hypotension and concern for hemorrhagic shock.  Patient received 2 units of O- blood, 1 L of IV fluids.  Despite the blood patient remained hypotensive and was started on Levophed.  That did not improve her pressure so she was then also started on an epi drip with improvement of blood pressure.  Will start massive transfusion protocol  as speaking with the daughter she currently wants  everything done.  Patient also was placed on a Protonix drip and given a dose of Rocephin.  Chest x-ray shows ET tube in appropriate place, CBC with a hemoglobin of 4 and leukocytosis of 15,000.  CMP with creatinine of 1.14, elevated LFTs and decreased albumin.  Patient's lactate is greater than 11.  INR is 2.3.  BNP is greater than 700.  Critical care consulted and spoke with Dr. Dulce Sellar with GI to make him aware  CRITICAL CARE Performed by: Conny Moening Total critical care time: 45 minutes Critical care time was exclusive of separately billable procedures and treating other patients. Critical care was necessary to treat or prevent imminent or life-threatening deterioration. Critical care was time spent personally by me on the following activities: development of treatment plan with patient and/or surrogate as well as nursing, discussions with consultants, evaluation of patient's response to treatment, examination of patient, obtaining history from patient or surrogate, ordering and performing treatments and interventions, ordering and review of laboratory studies, ordering and review of radiographic studies, pulse oximetry and re-evaluation of patient's condition.   Final Clinical Impressions(s) / ED Diagnoses   Final diagnoses:  Gastrointestinal hemorrhage, unspecified gastrointestinal hemorrhage type  Hemorrhagic shock (HCC)  Anemia, unspecified type    ED Discharge Orders    None       Gwyneth Sprout, MD 11/22/18 [phone]

## 2018-11-23 ENCOUNTER — Inpatient Hospital Stay (HOSPITAL_COMMUNITY): Payer: Medicare Other

## 2018-11-23 DIAGNOSIS — I361 Nonrheumatic tricuspid (valve) insufficiency: Secondary | ICD-10-CM

## 2018-11-23 DIAGNOSIS — D62 Acute posthemorrhagic anemia: Secondary | ICD-10-CM

## 2018-11-23 DIAGNOSIS — I219 Acute myocardial infarction, unspecified: Secondary | ICD-10-CM

## 2018-11-23 DIAGNOSIS — R0689 Other abnormalities of breathing: Secondary | ICD-10-CM

## 2018-11-23 DIAGNOSIS — K921 Melena: Principal | ICD-10-CM

## 2018-11-23 DIAGNOSIS — D689 Coagulation defect, unspecified: Secondary | ICD-10-CM

## 2018-11-23 DIAGNOSIS — I071 Rheumatic tricuspid insufficiency: Secondary | ICD-10-CM

## 2018-11-23 LAB — PREPARE FRESH FROZEN PLASMA
Unit division: 0
Unit division: 0
Unit division: 0
Unit division: 0
Unit division: 0
Unit division: 0

## 2018-11-23 LAB — BPAM FFP
Blood Product Expiration Date: 202010052359
Blood Product Expiration Date: 202010052359
Blood Product Expiration Date: 202010052359
Blood Product Expiration Date: 202010052359
Blood Product Expiration Date: 202010212359
Blood Product Expiration Date: 202010222359
ISSUE DATE / TIME: 202009301822
ISSUE DATE / TIME: 202009301822
ISSUE DATE / TIME: 202009301831
ISSUE DATE / TIME: 202009301831
Unit Type and Rh: 600
Unit Type and Rh: 600
Unit Type and Rh: 6200
Unit Type and Rh: 6200
Unit Type and Rh: 6200
Unit Type and Rh: 6200

## 2018-11-23 LAB — CBC
HCT: 27 % — ABNORMAL LOW (ref 36.0–46.0)
HCT: 26.5 % — ABNORMAL LOW (ref 36.0–46.0)
HCT: 24.2 % — ABNORMAL LOW (ref 36.0–46.0)
HCT: 23 % — ABNORMAL LOW (ref 36.0–46.0)
Hemoglobin: 9.1 g/dL — ABNORMAL LOW (ref 12.0–15.0)
Hemoglobin: 8.9 g/dL — ABNORMAL LOW (ref 12.0–15.0)
Hemoglobin: 8.6 g/dL — ABNORMAL LOW (ref 12.0–15.0)
Hemoglobin: 8.3 g/dL — ABNORMAL LOW (ref 12.0–15.0)
MCH: 27.7 pg (ref 26.0–34.0)
MCH: 27.5 pg (ref 26.0–34.0)
MCH: 28.6 pg (ref 26.0–34.0)
MCH: 29 pg (ref 26.0–34.0)
MCHC: 33.7 g/dL (ref 30.0–36.0)
MCHC: 33.6 g/dL (ref 30.0–36.0)
MCHC: 35.5 g/dL (ref 30.0–36.0)
MCHC: 36.1 g/dL — ABNORMAL HIGH (ref 30.0–36.0)
MCV: 82.3 fL (ref 80.0–100.0)
MCV: 81.8 fL (ref 80.0–100.0)
MCV: 80.4 fL (ref 80.0–100.0)
MCV: 80.4 fL (ref 80.0–100.0)
Platelets: 103 10*3/uL — ABNORMAL LOW (ref 150–400)
Platelets: 106 10*3/uL — ABNORMAL LOW (ref 150–400)
Platelets: 106 10*3/uL — ABNORMAL LOW (ref 150–400)
Platelets: 108 10*3/uL — ABNORMAL LOW (ref 150–400)
RBC: 3.28 MIL/uL — ABNORMAL LOW (ref 3.87–5.11)
RBC: 3.24 MIL/uL — ABNORMAL LOW (ref 3.87–5.11)
RBC: 3.01 MIL/uL — ABNORMAL LOW (ref 3.87–5.11)
RBC: 2.86 MIL/uL — ABNORMAL LOW (ref 3.87–5.11)
RDW: 16.5 % — ABNORMAL HIGH (ref 11.5–15.5)
RDW: 16.7 % — ABNORMAL HIGH (ref 11.5–15.5)
RDW: 17.4 % — ABNORMAL HIGH (ref 11.5–15.5)
RDW: 17.5 % — ABNORMAL HIGH (ref 11.5–15.5)
WBC: 29.6 10*3/uL — ABNORMAL HIGH (ref 4.0–10.5)
WBC: 24.3 10*3/uL — ABNORMAL HIGH (ref 4.0–10.5)
WBC: 36.7 10*3/uL — ABNORMAL HIGH (ref 4.0–10.5)
WBC: 34.1 10*3/uL — ABNORMAL HIGH (ref 4.0–10.5)
nRBC: 0.1 % (ref 0.0–0.2)
nRBC: 0.2 % (ref 0.0–0.2)
nRBC: 0.1 % (ref 0.0–0.2)
nRBC: 0.1 % (ref 0.0–0.2)

## 2018-11-23 LAB — POCT I-STAT 7, (LYTES, BLD GAS, ICA,H+H)
Bicarbonate: 25.4 mmol/L (ref 20.0–28.0)
Calcium, Ion: 1.08 mmol/L — ABNORMAL LOW (ref 1.15–1.40)
HCT: 25 % — ABNORMAL LOW (ref 36.0–46.0)
Hemoglobin: 8.5 g/dL — ABNORMAL LOW (ref 12.0–15.0)
O2 Saturation: 100 %
Potassium: 4.3 mmol/L (ref 3.5–5.1)
Sodium: 131 mmol/L — ABNORMAL LOW (ref 135–145)
TCO2: 27 mmol/L (ref 22–32)
pCO2 arterial: 42.8 mmHg (ref 32.0–48.0)
pH, Arterial: 7.381 (ref 7.350–7.450)
pO2, Arterial: 203 mmHg — ABNORMAL HIGH (ref 83.0–108.0)

## 2018-11-23 LAB — TROPONIN I (HIGH SENSITIVITY)
Troponin I (High Sensitivity): 1721 ng/L (ref <18)
Troponin I (High Sensitivity): 2378 ng/L (ref <18)
Troponin I (High Sensitivity): 4017 ng/L (ref <18)
Troponin I (High Sensitivity): 3844 ng/L (ref <18)
Troponin I (High Sensitivity): 3802 ng/L (ref <18)

## 2018-11-23 LAB — DIC (DISSEMINATED INTRAVASCULAR COAGULATION)PANEL
D-Dimer, Quant: 5.03 ug/mL-FEU — ABNORMAL HIGH (ref 0.00–0.50)
Fibrinogen: 192 mg/dL — ABNORMAL LOW (ref 210–475)
INR: 1.7 — ABNORMAL HIGH (ref 0.8–1.2)
Platelets: 103 10*3/uL — ABNORMAL LOW (ref 150–400)
Prothrombin Time: 19.6 seconds — ABNORMAL HIGH (ref 11.4–15.2)
Smear Review: NONE SEEN
aPTT: 30 seconds (ref 24–36)

## 2018-11-23 LAB — LACTIC ACID, PLASMA
Lactic Acid, Venous: 3.7 mmol/L (ref 0.5–1.9)
Lactic Acid, Venous: 2.4 mmol/L (ref 0.5–1.9)
Lactic Acid, Venous: 2 mmol/L (ref 0.5–1.9)

## 2018-11-23 LAB — PROTIME-INR
INR: 1.6 — ABNORMAL HIGH (ref 0.8–1.2)
Prothrombin Time: 19 seconds — ABNORMAL HIGH (ref 11.4–15.2)

## 2018-11-23 LAB — BASIC METABOLIC PANEL
Anion gap: 8 (ref 5–15)
BUN: 34 mg/dL — ABNORMAL HIGH (ref 8–23)
CO2: 25 mmol/L (ref 22–32)
Calcium: 7.2 mg/dL — ABNORMAL LOW (ref 8.9–10.3)
Chloride: 98 mmol/L (ref 98–111)
Creatinine, Ser: 1.28 mg/dL — ABNORMAL HIGH (ref 0.44–1.00)
GFR calc Af Amer: 44 mL/min — ABNORMAL LOW (ref >60)
GFR calc non Af Amer: 38 mL/min — ABNORMAL LOW (ref >60)
Glucose, Bld: 126 mg/dL — ABNORMAL HIGH (ref 70–99)
Potassium: 4.5 mmol/L (ref 3.5–5.1)
Sodium: 131 mmol/L — ABNORMAL LOW (ref 135–145)

## 2018-11-23 LAB — BLOOD PRODUCT ORDER (VERBAL) VERIFICATION

## 2018-11-23 LAB — ECHOCARDIOGRAM COMPLETE
Height: 66 in
Weight: 4035.3 oz

## 2018-11-23 LAB — MAGNESIUM: Magnesium: 1.5 mg/dL — ABNORMAL LOW (ref 1.7–2.4)

## 2018-11-23 LAB — PHOSPHORUS: Phosphorus: 4.8 mg/dL — ABNORMAL HIGH (ref 2.5–4.6)

## 2018-11-23 LAB — MRSA PCR SCREENING: MRSA by PCR: NEGATIVE

## 2018-11-23 MED ORDER — MAGNESIUM SULFATE 2 GM/50ML IV SOLN
2.0000 g | Freq: Once | INTRAVENOUS | Status: AC
  Administered 2018-11-23: 10:00:00 2 g via INTRAVENOUS
  Filled 2018-11-23: qty 50

## 2018-11-23 MED ORDER — SODIUM CHLORIDE 0.9 % IV SOLN
2.0000 g | INTRAVENOUS | Status: AC
  Administered 2018-11-23 – 2018-11-27 (×5): 2 g via INTRAVENOUS
  Filled 2018-11-23 (×2): qty 20
  Filled 2018-11-23 (×3): qty 2

## 2018-11-23 MED FILL — Medication: Qty: 1 | Status: AC

## 2018-11-23 NOTE — Consult Note (Signed)
Cardiology Consultation:   Patient ID: Marcia Saunders MRN: [id number]; DOB: [birth year]  Admit date: 11/22/2018 Date of Consult: 11/23/2018  Primary Care Provider: Patient, No Pcp Per Primary Cardiologist: No primary care provider on file.  Primary Electrophysiologist:  None    Patient Profile:   Marcia Saunders is a 85 y.o. female with a hx of moderate CAD and HTN who is being seen today for the evaluation of abnormal cardiac enzymes in the setting of hemorrhagic shock at the request of Dr. Loanne Drilling.  History of Present Illness:   Marcia Saunders he is intubated and sedated and unable to provide history, which is obtained from her family and the chart.  The medical providers in many years and her past medical history is largely unknown.  She apparently had PEA arrest in the setting of hematochezia and suspected hemorrhagic shock, probably related to lower GI bleed.  She was found unconscious on the commode by her daughter.  Although EMS initially detected a pulse she subsequent became pulseless but recovered spontaneous circulation after administration of intravenous epinephrine.  Initial hemoglobin was 4.  He was coagulopathic with an INR of 2.3, cause uncertain.  She requires pressors.  She is receiving PRBC and FFP transfusions.  Before these events occurred she had been experiencing dyspnea, generalized swelling and weakness for at least a month.  Her granddaughter, Apolonio Schneiders, reports that she has witnessed her grandmother having apnea when she sleeps.  She is a very loud snorer.  She has some daytime hypersomnolence.    Past medical and surgical history  We have very limited knowledge about her past medical or surgical history, but there is a record of a cardiac catheterization performed in 2009. This showed normal left ventricular systolic function with EF 55 to 60% and scattered nonobstructive stenoses (proximal LAD 50-60%, mid LAD 20-30%, patent circumflex and RCA systems). The cath  report mentions that she had "uncontrolled hypertension"  Home Medications:  Prior to Admission medications   Not on File    Inpatient Medications: Scheduled Meds:  chlorhexidine gluconate (MEDLINE KIT)  15 mL Mouth Rinse BID   Chlorhexidine Gluconate Cloth  6 each Topical Daily   mouth rinse  15 mL Mouth Rinse 10 times per day   [START ON 11/26/2018] pantoprazole  40 mg Intravenous Q12H   Continuous Infusions:  sodium chloride     cefTRIAXone (ROCEPHIN)  IV Stopped (11/23/18 1000)   norepinephrine (LEVOPHED) Adult infusion 5 mcg/min (11/23/18 1636)   pantoprazole (PROTONIX) IVPB     pantoprozole (PROTONIX) infusion 8 mg/hr (11/23/18 1300)   PRN Meds: Place/Maintain arterial line **AND** sodium chloride, fentaNYL (SUBLIMAZE) injection, fentaNYL (SUBLIMAZE) injection, midazolam  Allergies:   No Known Allergies  Social History:    History of smoking <1 pack a week for 20+ years, quit in the 1980s.  She is widowed retired Hotel manager.  Social History   Socioeconomic History   Marital status: Widowed    Spouse name: Not on file   Number of children: Not on file   Years of education: Not on file   Highest education level: Not on file  Occupational History   Not on file  Social Needs   Financial resource strain: Not on file   Food insecurity    Worry: Not on file    Inability: Not on file   Transportation needs    Medical: Not on file    Non-medical: Not on file  Tobacco Use   Smoking status: Not on file  Substance and Sexual Activity   Alcohol use: Not on file   Drug use: Not on file   Sexual activity: Not on file  Lifestyle   Physical activity    Days per week: Not on file    Minutes per session: Not on file   Stress: Not on file  Relationships   Social connections    Talks on phone: Not on file    Gets together: Not on file    Attends religious service: Not on file    Active member of club or organization: Not on file    Attends  meetings of clubs or organizations: Not on file    Relationship status: Not on file   Intimate partner violence    Fear of current or ex partner: Not on file    Emotionally abused: Not on file    Physically abused: Not on file    Forced sexual activity: Not on file  Other Topics Concern   Not on file  Social History Narrative   Not on file    Family History:   Reportedly significant for epilepsy causing the death of her father at age 8.  Her mother died of old age at 59.  ROS:  Please see the history of present illness.  Due to patient's condition her detailed review of systems could not be obtained  Physical Exam/Data:   Vitals:   11/23/18 1400 11/23/18 1445 11/23/18 1503 11/23/18 1553  BP: (!) 86/55 132/75 (!) 120/51   Pulse:   88   Resp: 18  19   Temp:    98.1 F (36.7 C)  TempSrc:    Oral  SpO2: (!) 84%     Weight:      Height:        Intake/Output Summary (Last 24 hours) at 11/23/2018 1713 Last data filed at 11/23/2018 1400 Gross per 24 hour  Intake 877.42 ml  Output 55 ml  Net 822.42 ml   Last 3 Weights 11/23/2018 11/22/2018 11/22/2018  Weight (lbs) 252 lb 3.3 oz 250 lb 260 lb  Weight (kg) 114.4 kg 113.4 kg 117.935 kg     Body mass index is 40.71 kg/m.  General: Morbidly obese, well developed, sedated, mechanically ventilated.  Does not respond, but was agitated earlier before sedation HEENT: normal Lymph: no adenopathy Neck: Prominent V waves to the angle of the jaw Endocrine:  No thryomegaly Vascular: No carotid bruits; FA pulses 2+ bilaterally without bruits  Cardiac:  normal S1, S2; RRR; 2/6 holosystolic mid precordial murmur, no diastolic murmurs, gallops or pericardial rubs heard Lungs:  clear to auscultation bilaterally, no wheezing, rhonchi or rales  Abd: Mildly distended, probable ascites but also obesity, nontender, unable to palpate the spleen or the liver Ext: Diffuse 3+ pitting edema involving all 4 extremities and the abdominal  wall Musculoskeletal:  No deformities Skin: warm and dry  Neuro: Sedated, unable to assess Psych: Unable to assess  EKG:  The EKG was personally reviewed and demonstrates:  Extremely reduced voltage throughout, but suspect NSR w PACs, not atrial fibrillation  Telemetry:  Telemetry was personally reviewed and demonstrates: Sinus rhythm with PACs  Relevant CV Studies: Echocardiogram 11/23/2018  1. Left ventricular ejection fraction, by visual estimation, is 60 to 65%. The left ventricle has normal function. Normal left ventricular size. There is moderately increased left ventricular hypertrophy.  2. Global right ventricle has mildly reduced systolic function.The right ventricular size is mildly enlarged. No increase in right ventricular wall thickness.  3.  Left atrial size was moderately dilated.  4. Right atrial size was severely dilated.  5. The mitral valve is normal in structure. Trace mitral valve regurgitation. No evidence of mitral stenosis.  6. The tricuspid valve is normal in structure. Tricuspid valve regurgitation severe.  7. The aortic valve is tricuspid Aortic valve regurgitation is trivial by color flow Doppler. Mild to moderate aortic valve sclerosis/calcification without any evidence of aortic stenosis.  8. The pulmonic valve was not well visualized. Pulmonic valve regurgitation was not assessed by color flow Doppler.  9. Mildly elevated pulmonary artery systolic pressure. 10. The inferior vena cava is normal in size with greater than 50% respiratory variability, suggesting right atrial pressure of 3 mmHg. 11. The interatrial septum was not well visualized.  CARDIAC CATH 08/01/2007  FINDINGS:  LV showed good LV systolic function.  LVH EF of 55-60%.  Left  main was long which was patent.  LAD has 50-60% napkin ring proximal LAD  stenosis and 20-30% mid sequential stenosis.  Diagonal 1 and 2 were very  small which were patent.  Ramus was small which was patent.  Left   circumflex was large which was patent.  OM-1 and OM-2 were small which  were patent.  RCA was patent.  The patient has codominant coronary  system.  The patient tolerated the procedure well.  There were no  complications.  The patient was transferred to recovery room in stable  condition.   Laboratory Data:  High Sensitivity Troponin:   Recent Labs  Lab 11/23/18 0007 11/23/18 0421 11/23/18 1255  TROPONINIHS 1,721* 2,378* 4,017*     Chemistry Recent Labs  Lab 11/22/18 1640 11/23/18 0344 11/23/18 0421  NA 132* 131* 131*  K 4.8 4.3 4.5  CL 97*  --  98  CO2 17*  --  25  GLUCOSE 93  --  126*  BUN 27*  --  34*  CREATININE 1.14*  --  1.28*  CALCIUM 7.3*  --  7.2*  GFRNONAA 44*  --  38*  GFRAA 50*  --  44*  ANIONGAP 18*  --  8    Recent Labs  Lab 11/22/18 1640  PROT 3.3*  ALBUMIN 1.3*  AST 151*  ALT 77*  ALKPHOS 59  BILITOT 0.6   Hematology Recent Labs  Lab 11/23/18 0007 11/23/18 0344 11/23/18 0421 11/23/18 1255  WBC 29.6*  --  24.3* 36.7*  RBC 3.28*  --  3.24* 3.01*  HGB 9.1* 8.5* 8.9* 8.6*  HCT 27.0* 25.0* 26.5* 24.2*  MCV 82.3  --  81.8 80.4  MCH 27.7  --  27.5 28.6  MCHC 33.7  --  33.6 35.5  RDW 16.5*  --  16.7* 17.4*  PLT 103*   103*  --  106* 106*   BNP Recent Labs  Lab 11/22/18 1640  BNP 739.2*    DDimer  Recent Labs  Lab 11/23/18 0007  DDIMER 5.03*     Radiology/Studies:  Ct Abdomen Pelvis Wo Contrast  Result Date: 11/23/2018 CLINICAL DATA:  Abdominal distension, history of rectal bleeding and hypotension. EXAM: CT ABDOMEN AND PELVIS WITHOUT CONTRAST TECHNIQUE: Multidetector CT imaging of the abdomen and pelvis was performed following the standard protocol without IV contrast. COMPARISON:  None. FINDINGS: Lower chest: Dense basilar airspace disease and small bilateral pleural effusions. Central venous access device terminates at the caval to atrial junction. Heart size markedly enlarged. Hepatobiliary: Low-density area along the anterior  hepatic margin in the intralobar fissure likely represents a cyst. Assessment is  markedly limited however given lack of intravenous contrast and secondary to patient body habitus and arm position. Sludge is seen within the gallbladder. No overt biliary ductal dilation. Pancreas: Unremarkable. No pancreatic ductal dilatation or surrounding inflammatory changes. Spleen: Limited assessment of the spleen secondary to beam hardening artifact from arm position. Adrenals/Urinary Tract: Adrenal glands are normal. No signs of hydronephrosis.  Renal contours are normal. Stomach/Bowel: Enteric tube in place within the stomach. No signs of acute gastrointestinal process. The appendix is not visualized, no secondary signs to suggest appendicitis. No signs of pneumatosis or bowel obstruction. Vascular/Lymphatic: Moderate calcific atherosclerosis. Vessels not well assessed without contrast. No signs of retroperitoneal adenopathy. No signs of pelvic adenopathy. Reproductive: Post hysterectomy. Other: Diffuse intra-abdominal ascites. Density measurements of fluid are hampered by technical factors secondary to body habitus and noise on the current exam. Extensive body wall edema. Musculoskeletal: Spinal degenerative change without acute or destructive bone process. IMPRESSION: 1. Anasarca. 2. Area of low attenuation along the anterior liver margin may represent a cyst or loculated fluid, this is not clear on the current exam which is markedly limited by body habitus and technical factors. 3. Basilar airspace disease may represent pneumonia or dependent consolidation associated with effusions. 4. Marked cardiomegaly. Electronically Signed   By: Zetta Bills M.D.   On: 11/23/2018 15:12   Dg Chest Port 1 View  Result Date: 11/23/2018 CLINICAL DATA:  Respiratory failure. EXAM: PORTABLE CHEST 1 VIEW COMPARISON:  Radiographs of November 22, 2018. FINDINGS: Stable cardiomegaly. Endotracheal and nasogastric tubes are in grossly good  position. Left internal jugular catheter is unchanged in position. No pneumothorax is noted. Mild left pleural effusion is noted with associated atelectasis or infiltrate. Minimal right basilar subsegmental atelectasis is noted. Bony thorax is unremarkable. IMPRESSION: Support apparatus in grossly good position. Stable left basilar opacity as described above. Stable minimal right basilar opacity as well. Electronically Signed   By: Marijo Conception M.D.   On: 11/23/2018 08:52   Dg Chest Port 1 View  Result Date: 11/22/2018 CLINICAL DATA:  85 year old female with central line placement. EXAM: PORTABLE CHEST 1 VIEW COMPARISON:  Earlier radiograph dated 11/22/2018 FINDINGS: Interval placement of a left IJ central venous line with tip at the cavoatrial junction. No pneumothorax. Endotracheal tube with tip at the level of the carina tilting towards the right mainstem bronchus. Recommend retraction by approximately 3 cm for optimal positioning. Enteric tube extends below the diaphragm with tip beyond the inferior margin of the image. No interval change in the appearance of the lungs or cardiomediastinal silhouette since the earlier radiograph. IMPRESSION: 1. Left IJ central venous line with tip at the cavoatrial junction. No pneumothorax. 2. Endotracheal tube with tip at the level of the carina tilting towards the right mainstem bronchus. Recommend retraction by 3 cm for optimal positioning. Electronically Signed   By: Anner Crete M.D.   On: 11/22/2018 21:22   Dg Chest Portable 1 View  Result Date: 11/22/2018 CLINICAL DATA:  Acute cardiac arrest. Intubation. EXAM: PORTABLE CHEST 1 VIEW COMPARISON:  None. FINDINGS: Patient is rotated to the left. Endotracheal tube and nasogastric tube are in appropriate position. Low lung volumes are noted. Atelectasis or infiltrate is seen in the left retrocardiac lung base. Right lung remains clear. No pneumothorax visualized. IMPRESSION: Low lung volumes, with atelectasis or  infiltrate in left retrocardiac lung base. Endotracheal tube and nasogastric tube in appropriate position. Electronically Signed   By: Marlaine Hind M.D.   On: 11/22/2018 17:01  Assessment and Plan:   1. Post hemorrhagic shock: Hemodynamics are improving but she is still pressor dependent.  Hemoglobin up to 8.6.  Lactic acid trending down.  Very poor urine output, high concern for post shock ATN in the next few days.  2. Demand ischemia:  Suspect demand ischemia/injury in the setting of critical anemia and shock. ECG does not support a diagnosis of major acute coronary event and there are no LV wall motion abnormalities on echo. 3. Right heart failure/anasarca: Hypoalbuminemia is compounding the swelling, but high level of suspicion for sleep disordered breathing and/or obesity-hypoventilation syndrome.  Severe right atrial enlargement suggests chronicity.  Not sure what to make of the elevated d-dimer, but she is clearly not a candidate for anticoagulation.  There is (at least) mild PAH and severe TR (suspect secondary). Marked discrepancy between LVH on echo and low voltage on ECG raises possibility of amyloidosis, but the RV appearance is atypical for that diagnosis and Doppler findings are not really consistent with restrictive cardiomyopathy. Low voltage may be explained by anasarca and obesity.  4. Atrial fibrillation: questionable diagnosis. I think P waves are seen on both ECGs and PACs make the rhythm irregular. During the echo there is distinct atrial contraction c/w sinus rhythm.  Obviously she cannot be anticoagulated. 5. Coagulopathy: Etiology uncertain.  Consider liver dysfunction due to passive congestion, although CT does not show evidence of cirrhosis.  Note severe hypoalbuminemia.    CRITICAL CARE Performed by: Dani Gobble Dilan Fullenwider   Total critical care time: 45 minutes  Critical care time was exclusive of separately billable procedures and treating other patients.  Critical care was  necessary to treat or prevent imminent or life-threatening deterioration.  Critical care was time spent personally by me on the following activities: development of treatment plan with patient and/or surrogate as well as nursing, discussions with consultants, evaluation of patient's response to treatment, examination of patient, obtaining history from patient or surrogate, ordering and performing treatments and interventions, ordering and review of laboratory studies, ordering and review of radiographic studies, pulse oximetry and re-evaluation of patient's condition.   For questions or updates, please contact [facility] Please consult [email or web address] for contact info under     Signed, Sanda Klein, MD  11/23/2018 5:13 PM

## 2018-11-23 NOTE — Progress Notes (Signed)
A-line attempts X2  unsuccessful, able to get adequate blood return but was unable to thread catheter into arterial lumen. RN aware. No hematoma noted

## 2018-11-23 NOTE — Progress Notes (Signed)
CRITICAL VALUE ALERT  Critical Value:  Troponin 1721 and lactic acid 3.7   Date & Time Notied:  11/23/2018 0230  Provider Notified: Dr Lucile Shutters  Orders Received/Actions taken: No new orders

## 2018-11-23 NOTE — Progress Notes (Signed)
RT transported patient to CT and returned to [facility]. No complications. RT will continue to monitor.

## 2018-11-23 NOTE — Progress Notes (Signed)
  Echocardiogram 2D Echocardiogram has been performed.  Loretto Belinsky G Sarahmarie Leavey 11/23/2018, 2:04 PM

## 2018-11-23 NOTE — Progress Notes (Signed)
Initial Nutrition Assessment  DOCUMENTATION CODES:   Obesity unspecified  INTERVENTION:   Tube feeding: -Vital High Protein @ 20 ml/hr via OGT -Increase by 10 ml Q4 hours to goal rate of 60 ml/hr (1440 ml)  At goal TF provides: 1440 kcals, 126 grams protein, 1204 ml free water. Meets 100% of needs.   NUTRITION DIAGNOSIS:   Inadequate oral intake related to inability to eat as evidenced by NPO status.  GOAL:   Patient will meet greater than or equal to 90% of their needs  MONITOR:   TF tolerance, Weight trends, Skin, Labs, Vent status, Diet advancement, I & O's  REASON FOR ASSESSMENT:   Ventilator    ASSESSMENT:   Patient with unknown PMH. Presents this admission with cardiac arrest due to presumed hemorrhagic shock from GI bleed.   Pt discussed during ICU rounds and with RN.   TTM deferred given bleed. Pressors being weaned. Requiring PRN sedation. Abdomen shows to be distended- GI suspects lower GI bleed. Plan for endoscopy. Family [place] the way from out of state to discuss code status. Will provide TF for when medically feasible.   Admission weight: 117.9 kg  Current weight: 114.4 kg  (no previous weight history recorded in chart)  Patient is currently intubated on ventilator support MV: 10.6  L/min Temp (24hrs), Avg:97.9 F (36.6 C), Min:97.6 F (36.4 C), Max:98.2 F (36.8 C)   I/O: +436 ml since admit UOP: 35 ml x 24 hrs   Drips: levophed Labs: Na 131 (L) CBG 93-126    NUTRITION - FOCUSED PHYSICAL EXAM:    Most Recent Value  Orbital Region  No depletion  Upper Arm Region  No depletion  Thoracic and Lumbar Region  Unable to assess  Buccal Region  No depletion  Temple Region  No depletion  Clavicle Bone Region  No depletion  Clavicle and Acromion Bone Region  No depletion  Scapular Bone Region  Unable to assess  Dorsal Hand  No depletion  Patellar Region  No depletion  Anterior Thigh Region  No depletion  Posterior Calf Region  No depletion   Edema (RD Assessment)  Severe  Hair  Reviewed  Eyes  Unable to assess  Mouth  Unable to assess  Skin  Reviewed  Nails  Reviewed     Diet Order:   Diet Order            Diet NPO time specified  Diet effective now              EDUCATION NEEDS:   Not appropriate for education at this time  Skin:  Skin Assessment: Skin Integrity Issues: Skin Integrity Issues:: Other (Comment) Other: MASD- groin  Last BM:  9/30  Height:   Ht Readings from Last 1 Encounters:  11/22/18 5\' 6"  (1.676 m)    Weight:   Wt Readings from Last 1 Encounters:  11/23/18 114.4 kg    Ideal Body Weight:  59.1 kg  BMI:  Body mass index is 40.71 kg/m.  Estimated Nutritional Needs:   Kcal:  1247-1588 kcal  Protein:  118-148 grams  Fluid:  >/= 1.5 L/day   Mariana Single RD, LDN Clinical Nutrition Pager # - [phone]

## 2018-11-23 NOTE — Consult Note (Signed)
Referring Provider: Dr. Loanne Drilling Primary Care Physician:  Patient, No Pcp Per Primary Gastroenterologist:  Althia Forts  Reason for Consultation:  GI bleed  HPI: Marcia Saunders is a 85 y.o. female admitted for cardiac arrest with presumed hemorrhagic shock with report of rectal bleeding over the last couple of days. Hgb 4 on admit and transfused 4 U PRBCs to 9.1. INR 2.3 on admit and following 2 U FFP, INR 1.6. Lactic acid greater than 11 on admit. Patient's pressors being weaned and being weaned off the ventilator. History obtained by chart review. Nurse reports one maroon stool overnight. No history of peptic ulcer disease. No known history of liver disease.  No past medical history on file.  PMH unable to obtain (unknown per chart)  Prior to Admission medications   Not on File    Scheduled Meds: . chlorhexidine gluconate (MEDLINE KIT)  15 mL Mouth Rinse BID  . Chlorhexidine Gluconate Cloth  6 each Topical Daily  . mouth rinse  15 mL Mouth Rinse 10 times per day  . [START ON 11/26/2018] pantoprazole  40 mg Intravenous Q12H   Continuous Infusions: . sodium chloride    . cefTRIAXone (ROCEPHIN)  IV    . magnesium sulfate bolus IVPB    . norepinephrine (LEVOPHED) Adult infusion 3 mcg/min (11/23/18 0700)  . pantoprazole (PROTONIX) IVPB    . pantoprozole (PROTONIX) infusion 8 mg/hr (11/23/18 0700)   PRN Meds:.Place/Maintain arterial line **AND** sodium chloride, fentaNYL (SUBLIMAZE) injection, fentaNYL (SUBLIMAZE) injection, midazolam  Allergies as of 11/22/2018  . (No Known Allergies)    No family history on file.  Social History   Socioeconomic History  . Marital status: Widowed    Spouse name: Not on file  . Number of children: Not on file  . Years of education: Not on file  . Highest education level: Not on file  Occupational History  . Not on file  Social Needs  . Financial resource strain: Not on file  . Food insecurity    Worry: Not on file    Inability: Not on  file  . Transportation needs    Medical: Not on file    Non-medical: Not on file  Tobacco Use  . Smoking status: Not on file  Substance and Sexual Activity  . Alcohol use: Not on file  . Drug use: Not on file  . Sexual activity: Not on file  Lifestyle  . Physical activity    Days per week: Not on file    Minutes per session: Not on file  . Stress: Not on file  Relationships  . Social Herbalist on phone: Not on file    Gets together: Not on file    Attends religious service: Not on file    Active member of club or organization: Not on file    Attends meetings of clubs or organizations: Not on file    Relationship status: Not on file  . Intimate partner violence    Fear of current or ex partner: Not on file    Emotionally abused: Not on file    Physically abused: Not on file    Forced sexual activity: Not on file  Other Topics Concern  . Not on file  Social History Narrative  . Not on file    Review of Systems: Unable to obtain  Physical Exam: Vital signs: Vitals:   11/23/18 0833 11/23/18 0857  BP:  120/76  Pulse:  87  Resp:    Temp: 98.2  F (36.8 C)   SpO2:  98%     General:   Intubated, elderly, obese, mild acute distress  Head: normocephalic, atraumatic Eyes: anicteric sclera ENT: oropharynx clear Neck: supple, nontender Lungs:  Coarse breath sounds anteriorly Heart:  Regular rate and rhythm; no murmurs, clicks, rubs,  or gallops. Abdomen: Distended, tender (facial grimace) periumbilically, obese, decreased bowel sounds  Rectal:  Deferred Ext: +LE edema  GI:  Lab Results: Recent Labs    11/22/18 1640 11/23/18 0007 11/23/18 0344 11/23/18 0421  WBC 14.9* 29.6*  --  24.3*  HGB 4.0* 9.1* 8.5* 8.9*  HCT 12.8* 27.0* 25.0* 26.5*  PLT 144* 103*  103*  --  106*   BMET Recent Labs    11/22/18 1640 11/23/18 0344 11/23/18 0421  NA 132* 131* 131*  K 4.8 4.3 4.5  CL 97*  --  98  CO2 17*  --  25  GLUCOSE 93  --  126*  BUN 27*  --  34*   CREATININE 1.14*  --  1.28*  CALCIUM 7.3*  --  7.2*   LFT Recent Labs    11/22/18 1640  PROT 3.3*  ALBUMIN 1.3*  AST 151*  ALT 77*  ALKPHOS 59  BILITOT 0.6   PT/INR Recent Labs    11/23/18 0007 11/23/18 0421  LABPROT 19.6* 19.0*  INR 1.7* 1.6*     Studies/Results: Dg Chest Port 1 View  Result Date: 11/23/2018 CLINICAL DATA:  Respiratory failure. EXAM: PORTABLE CHEST 1 VIEW COMPARISON:  Radiographs of November 22, 2018. FINDINGS: Stable cardiomegaly. Endotracheal and nasogastric tubes are in grossly good position. Left internal jugular catheter is unchanged in position. No pneumothorax is noted. Mild left pleural effusion is noted with associated atelectasis or infiltrate. Minimal right basilar subsegmental atelectasis is noted. Bony thorax is unremarkable. IMPRESSION: Support apparatus in grossly good position. Stable left basilar opacity as described above. Stable minimal right basilar opacity as well. Electronically Signed   By: Marijo Conception M.D.   On: 11/23/2018 08:52   Dg Chest Port 1 View  Result Date: 11/22/2018 CLINICAL DATA:  85 year old female with central line placement. EXAM: PORTABLE CHEST 1 VIEW COMPARISON:  Earlier radiograph dated 11/22/2018 FINDINGS: Interval placement of a left IJ central venous line with tip at the cavoatrial junction. No pneumothorax. Endotracheal tube with tip at the level of the carina tilting towards the right mainstem bronchus. Recommend retraction by approximately 3 cm for optimal positioning. Enteric tube extends below the diaphragm with tip beyond the inferior margin of the image. No interval change in the appearance of the lungs or cardiomediastinal silhouette since the earlier radiograph. IMPRESSION: 1. Left IJ central venous line with tip at the cavoatrial junction. No pneumothorax. 2. Endotracheal tube with tip at the level of the carina tilting towards the right mainstem bronchus. Recommend retraction by 3 cm for optimal  positioning. Electronically Signed   By: Anner Crete M.D.   On: 11/22/2018 21:22   Dg Chest Portable 1 View  Result Date: 11/22/2018 CLINICAL DATA:  Acute cardiac arrest. Intubation. EXAM: PORTABLE CHEST 1 VIEW COMPARISON:  None. FINDINGS: Patient is rotated to the left. Endotracheal tube and nasogastric tube are in appropriate position. Low lung volumes are noted. Atelectasis or infiltrate is seen in the left retrocardiac lung base. Right lung remains clear. No pneumothorax visualized. IMPRESSION: Low lung volumes, with atelectasis or infiltrate in left retrocardiac lung base. Endotracheal tube and nasogastric tube in appropriate position. Electronically Signed   By: Marlaine Hind  M.D.   On: 11/22/2018 17:01    Impression/Plan: Cardiac arrest with hematochezia concerning for hemorrhagic shock - suspect a lower GI source and doubt a peptic ulcer bleed. Needs imaging of her abdomen with her distention, elevated lactate, and bleeding. Could have ischemic bowel. Non-contrast abd/pelvic CT ordered. Continue Protonix drip for now. Would hold off on any endoscopic procedures at this time. Continue supportive care. Will follow.    LOS: 1 day   [name]  11/23/2018, 9:18 AM  Questions please call [phone]

## 2018-11-23 NOTE — Progress Notes (Signed)
[facility] Progress Note Patient Name: Marcia Saunders DOB: 1930/10/30 MRN: [id number]   Date of Service  11/23/2018  HPI/Events of Note  Pt with moderate lactic acidosis and marked elevation of her Troponin following cardiac arrest with CPR.  eICU Interventions  Trend Troponin and lactate.        Kerry Kass Ogan 11/23/2018, 3:13 AM

## 2018-11-23 NOTE — Progress Notes (Signed)
NAME:  Marcia Saunders, MRN:  [id number], DOB:  May 03, 1930, LOS: 1 ADMISSION DATE:  11/22/2018, CONSULTATION DATE:  11/22/18 REFERRING MD:  Anitra Lauth  CHIEF COMPLAINT:  Cardiac Arrest   Brief History   Marcia Saunders is a 85 y.o. female who was admitted 9/30 after cardiac arrest presumed due to hemorrhagic shock from GI bleed of unclear etiology.  History of present illness   Pt is encephalopathic; therefore, this HPI is obtained from chart review. Marcia Saunders is a 85 y.o. female who has unknown PMH as she has not seen a healthcare provider in several years.  She presented to [facility] ED 9/30 after cardiac arrest.  She apparently had not been feeling well for around a month and over the past week or so, began to experience dyspnea, anasarca, weakness.  She also began to experience blood tinged stools starting around 9/28.  On 9/30, she went to use the bathroom and daughter later found her unresponsive on the commode.  Fire was called and upon their arrival, she did have a pulse; however, while on scene, she lost pulses.  She required 2 rounds epi before ROSC.  Upon arrival to ED, she empirically received 2u PRBC's given her appearance and concern for hemorrhagic shock.  She briefly lost pulses again but never required CPR.  She was however started on levophed and epinephrine infusions given persistent hypotension.  Labs from ED were noteable for Hgb of 4 and she received an additional 2u PRBC with 2u FFP ordered.  Per daughter, she did not believe that pt would have wanted to be intubated or resuscitated; however, after consulting with her brothers who are out of state, they have decided to continue current efforts while they make their way to the hospital.  GI has been consulted; however, given her hemodynamic instability, they have opted to defer any invasive procedures for now and continue with supportive care.  If she were to stabilize overnight, EGD / colonoscopy can be considered 10/1.  Past  Medical History  Unknown.  Significant Hospital Events   9/30 > admit.  Consults:  GI.  Procedures:  ETT 9/30 >  CVL pending 9/30 >  Arterial line 9/30 >  Significant Diagnostic Tests:  CXR 9/30 > atelectasis. Echo 10/1 >   Micro Data:  SARS CoV2 9/30 >   Antimicrobials:  Ceftriaxone 9/30 x 1.   Interim history/subjective:  Weaned off epi. Agitated overnight, responsive to PRN sedation. Overnight, she received a total of 4 PRBC and 2 FFP.  Objective:  Blood pressure 131/69, pulse (!) 106, temperature 97.6 F (36.4 C), temperature source Oral, resp. rate 18, height 5\' 6"  (1.676 m), weight 114.4 kg, SpO2 95 %. CVP:  [21 mmHg] 21 mmHg  Vent Mode: PRVC FiO2 (%):  [75 %-100 %] 75 % Set Rate:  [18 bmp] 18 bmp Vt Set:  [470 mL] 470 mL PEEP:  [5 cmH20] 5 cmH20 Plateau Pressure:  [24 cmH20-26 cmH20] 24 cmH20   Intake/Output Summary (Last 24 hours) at 11/23/2018 0705 Last data filed at 11/23/2018 0600 Gross per 24 hour  Intake 393.55 ml  Output 35 ml  Net 358.55 ml   Filed Weights   11/22/18 1632 11/22/18 2130 11/23/18 0422  Weight: 117.9 kg 113.4 kg 114.4 kg   Physical Exam: General: Elderly-appearing, no acute distress HENT: [place], AT, ETT in place Eyes: EOMI, 36mm and minimally reactive, no scleral icterus Respiratory: Diminished breath sounds bilaterally.  No crackles, wheezing or rales Cardiovascular: RRR, -M/R/G, no  JVD GI: Firm, hypoactive BS+ Extremities:Anasarca, -tenderness Neuro: Sedated GU: Foley in place  Assessment & Plan:   S/p cardiac arrest in setting of suspected hemorrhagic shock from presumed GIB Hematochezia Acute blood loss anemia S/p 4u PRBC in ED and 2u FFP currently transfusing. - No TTM given bleeding. - Trend CBC q8h - GI consulted. Will re-evaluate this morning - Continue PPI gtt  Suspected hemorrhagic shock - Off epinephrine  - Wean norepinephrine for goal MAP > 65. - F/u echo, suspect some degree of underlying CHF. - Trend  troponins - Continue Ceftriaxone - Follow-up cultures  Coagulopathy (INR 2.3) - unclear etiology, ? Underlying liver dysfunction given mild bump in transaminases.  S/p 2u FFP. - Follow coags.  Respiratory insufficiency - due to inability to protect the airway in the setting of above. - Full vent support - Wean FIO2/PEEP for goal SpO2 88-95% - Bronchial hygiene - VAP  AKI Hyponatremia. AGMA - lactate 2/2 above. - DC mIVF - Trend lactate - Follow BMP  Hypomagnesemia - Replete  Best Practice:  Diet: NPO Pain/Anxiety/Delirium protocol (if indicated): Fentanyl PRN / Midazolam PRN.  RASS goal 0. VAP protocol (if indicated): In place. DVT prophylaxis: SCD's only. GI prophylaxis: PPI gtt. Glucose control: SSI if glucose consistently > 180. Mobility: Bedrest. Code Status: Full - 2 sons on way to hospital to discuss further.  Remains full code in the interim. Family Communication: Updated daughter on phone 10/1(Razan [phone]). Sons will not arrive until Friday Disposition: ICU  Labs   CBC: Recent Labs  Lab 11/22/18 1640 11/23/18 0007 11/23/18 0344 11/23/18 0421  WBC 14.9* 29.6*  --  24.3*  NEUTROABS 11.2*  --   --   --   HGB 4.0* 9.1* 8.5* 8.9*  HCT 12.8* 27.0* 25.0* 26.5*  MCV 80.0 82.3  --  81.8  PLT 144* 103*  103*  --  106*   Basic Metabolic Panel: Recent Labs  Lab 11/22/18 1640 11/23/18 0344 11/23/18 0421  NA 132* 131* 131*  K 4.8 4.3 4.5  CL 97*  --  98  CO2 17*  --  25  GLUCOSE 93  --  126*  BUN 27*  --  34*  CREATININE 1.14*  --  1.28*  CALCIUM 7.3*  --  7.2*  MG  --   --  1.5*  PHOS  --   --  4.8*   GFR: Estimated Creatinine Clearance: 40.5 mL/min (A) (by C-G formula based on SCr of 1.28 mg/dL (H)). Recent Labs  Lab 11/22/18 1640 11/23/18 0007 11/23/18 0421 11/23/18 0601  WBC 14.9* 29.6* 24.3*  --   LATICACIDVEN >11.0* 3.7*  --  2.4*   Liver Function Tests: Recent Labs  Lab 11/22/18 1640  AST 151*  ALT 77*  ALKPHOS 59   BILITOT 0.6  PROT 3.3*  ALBUMIN 1.3*   No results for input(s): LIPASE, AMYLASE in the last 168 hours. No results for input(s): AMMONIA in the last 168 hours. ABG    Component Value Date/Time   PHART 7.381 11/23/2018 0344   PCO2ART 42.8 11/23/2018 0344   PO2ART 203.0 (H) 11/23/2018 0344   HCO3 25.4 11/23/2018 0344   TCO2 27 11/23/2018 0344   O2SAT 100.0 11/23/2018 0344    Coagulation Profile: Recent Labs  Lab 11/22/18 1640 11/23/18 0007 11/23/18 0421  INR 2.3* 1.7* 1.6*   Cardiac Enzymes: No results for input(s): CKTOTAL, CKMB, CKMBINDEX, TROPONINI in the last 168 hours. HbA1C: No results found for: HGBA1C CBG:  Recent Labs  Lab 11/22/18 1629  GLUCAP 91    The patient is critically ill with multiple organ systems failure and requires high complexity decision making for assessment and support, frequent evaluation and titration of therapies, application of advanced monitoring technologies and extensive interpretation of multiple databases.   Critical Care Time devoted to patient care services described in this note is 36 Minutes. Rodman Pickle, M.D. [facility] Pulmonary/Critical Care Medicine 11/23/2018 7:05 AM  Pager: [phone] After hours pager: [phone]

## 2018-11-24 ENCOUNTER — Inpatient Hospital Stay (HOSPITAL_COMMUNITY): Payer: Medicare Other

## 2018-11-24 DIAGNOSIS — I2781 Cor pulmonale (chronic): Secondary | ICD-10-CM

## 2018-11-24 DIAGNOSIS — J9601 Acute respiratory failure with hypoxia: Secondary | ICD-10-CM | POA: Insufficient documentation

## 2018-11-24 DIAGNOSIS — N179 Acute kidney failure, unspecified: Secondary | ICD-10-CM

## 2018-11-24 DIAGNOSIS — R578 Other shock: Secondary | ICD-10-CM

## 2018-11-24 DIAGNOSIS — K922 Gastrointestinal hemorrhage, unspecified: Secondary | ICD-10-CM

## 2018-11-24 LAB — BASIC METABOLIC PANEL
Anion gap: 9 (ref 5–15)
Anion gap: 10 (ref 5–15)
Anion gap: 10 (ref 5–15)
BUN: 47 mg/dL — ABNORMAL HIGH (ref 8–23)
BUN: 49 mg/dL — ABNORMAL HIGH (ref 8–23)
BUN: 51 mg/dL — ABNORMAL HIGH (ref 8–23)
CO2: 26 mmol/L (ref 22–32)
CO2: 24 mmol/L (ref 22–32)
CO2: 24 mmol/L (ref 22–32)
Calcium: 7.4 mg/dL — ABNORMAL LOW (ref 8.9–10.3)
Calcium: 7.6 mg/dL — ABNORMAL LOW (ref 8.9–10.3)
Calcium: 7.5 mg/dL — ABNORMAL LOW (ref 8.9–10.3)
Chloride: 99 mmol/L (ref 98–111)
Chloride: 100 mmol/L (ref 98–111)
Chloride: 99 mmol/L (ref 98–111)
Creatinine, Ser: 2.01 mg/dL — ABNORMAL HIGH (ref 0.44–1.00)
Creatinine, Ser: 1.83 mg/dL — ABNORMAL HIGH (ref 0.44–1.00)
Creatinine, Ser: 1.86 mg/dL — ABNORMAL HIGH (ref 0.44–1.00)
GFR calc Af Amer: 25 mL/min — ABNORMAL LOW (ref >60)
GFR calc Af Amer: 28 mL/min — ABNORMAL LOW (ref >60)
GFR calc Af Amer: 28 mL/min — ABNORMAL LOW (ref >60)
GFR calc non Af Amer: 22 mL/min — ABNORMAL LOW (ref >60)
GFR calc non Af Amer: 25 mL/min — ABNORMAL LOW (ref >60)
GFR calc non Af Amer: 24 mL/min — ABNORMAL LOW (ref >60)
Glucose, Bld: 86 mg/dL (ref 70–99)
Glucose, Bld: 83 mg/dL (ref 70–99)
Glucose, Bld: 81 mg/dL (ref 70–99)
Potassium: 4.4 mmol/L (ref 3.5–5.1)
Potassium: 4 mmol/L (ref 3.5–5.1)
Potassium: 3.9 mmol/L (ref 3.5–5.1)
Sodium: 134 mmol/L — ABNORMAL LOW (ref 135–145)
Sodium: 134 mmol/L — ABNORMAL LOW (ref 135–145)
Sodium: 133 mmol/L — ABNORMAL LOW (ref 135–145)

## 2018-11-24 LAB — GLUCOSE, CAPILLARY
Glucose-Capillary: 82 mg/dL (ref 70–99)
Glucose-Capillary: 81 mg/dL (ref 70–99)
Glucose-Capillary: 70 mg/dL (ref 70–99)

## 2018-11-24 LAB — MAGNESIUM
Magnesium: 1.7 mg/dL (ref 1.7–2.4)
Magnesium: 1.8 mg/dL (ref 1.7–2.4)

## 2018-11-24 LAB — CBC
HCT: 22.7 % — ABNORMAL LOW (ref 36.0–46.0)
HCT: 21.6 % — ABNORMAL LOW (ref 36.0–46.0)
HCT: 23.5 % — ABNORMAL LOW (ref 36.0–46.0)
Hemoglobin: 7.9 g/dL — ABNORMAL LOW (ref 12.0–15.0)
Hemoglobin: 7.4 g/dL — ABNORMAL LOW (ref 12.0–15.0)
Hemoglobin: 7.8 g/dL — ABNORMAL LOW (ref 12.0–15.0)
MCH: 28.2 pg (ref 26.0–34.0)
MCH: 28.1 pg (ref 26.0–34.0)
MCH: 27.4 pg (ref 26.0–34.0)
MCHC: 34.8 g/dL (ref 30.0–36.0)
MCHC: 34.3 g/dL (ref 30.0–36.0)
MCHC: 33.2 g/dL (ref 30.0–36.0)
MCV: 81.1 fL (ref 80.0–100.0)
MCV: 82.1 fL (ref 80.0–100.0)
MCV: 82.5 fL (ref 80.0–100.0)
Platelets: 106 10*3/uL — ABNORMAL LOW (ref 150–400)
Platelets: 106 10*3/uL — ABNORMAL LOW (ref 150–400)
Platelets: 125 10*3/uL — ABNORMAL LOW (ref 150–400)
RBC: 2.8 MIL/uL — ABNORMAL LOW (ref 3.87–5.11)
RBC: 2.63 MIL/uL — ABNORMAL LOW (ref 3.87–5.11)
RBC: 2.85 MIL/uL — ABNORMAL LOW (ref 3.87–5.11)
RDW: 18.4 % — ABNORMAL HIGH (ref 11.5–15.5)
RDW: 18.6 % — ABNORMAL HIGH (ref 11.5–15.5)
RDW: 18.7 % — ABNORMAL HIGH (ref 11.5–15.5)
WBC: 25.4 10*3/uL — ABNORMAL HIGH (ref 4.0–10.5)
WBC: 22.9 10*3/uL — ABNORMAL HIGH (ref 4.0–10.5)
WBC: 23.7 10*3/uL — ABNORMAL HIGH (ref 4.0–10.5)
nRBC: 0.1 % (ref 0.0–0.2)
nRBC: 0.1 % (ref 0.0–0.2)
nRBC: 0 % (ref 0.0–0.2)

## 2018-11-24 LAB — PROTIME-INR
INR: 1.5 — ABNORMAL HIGH (ref 0.8–1.2)
Prothrombin Time: 18.3 seconds — ABNORMAL HIGH (ref 11.4–15.2)

## 2018-11-24 LAB — PHOSPHORUS
Phosphorus: 4.5 mg/dL (ref 2.5–4.6)
Phosphorus: 4.5 mg/dL (ref 2.5–4.6)

## 2018-11-24 MED ORDER — VITAL HIGH PROTEIN PO LIQD: 1000.0000 mL | ORAL | Status: DC

## 2018-11-24 MED ORDER — FUROSEMIDE 10 MG/ML IJ SOLN
60.0000 mg | Freq: Once | INTRAMUSCULAR | Status: AC
  Administered 2018-11-24: 60 mg via INTRAVENOUS
  Filled 2018-11-24: qty 6

## 2018-11-24 MED ORDER — VITAL HIGH PROTEIN PO LIQD
1000.0000 mL | ORAL | Status: DC
  Filled 2018-11-24 (×2): qty 1000

## 2018-11-24 MED ORDER — FUROSEMIDE 10 MG/ML IJ SOLN
40.0000 mg | Freq: Once | INTRAMUSCULAR | Status: AC
  Administered 2018-11-24: 40 mg via INTRAVENOUS
  Filled 2018-11-24: qty 4

## 2018-11-24 MED ORDER — PANTOPRAZOLE SODIUM 40 MG IV SOLR
40.0000 mg | Freq: Two times a day (BID) | INTRAVENOUS | Status: DC
  Administered 2018-11-24 – 2018-12-01 (×15): 40 mg via INTRAVENOUS
  Filled 2018-11-24 (×15): qty 40

## 2018-11-24 MED ORDER — PRO-STAT SUGAR FREE PO LIQD
30.0000 mL | Freq: Two times a day (BID) | ORAL | Status: DC
  Filled 2018-11-24: qty 30

## 2018-11-24 MED ORDER — PHENOL 1.4 % MT LIQD: 1.0000 | OROMUCOSAL | Status: DC | PRN

## 2018-11-24 NOTE — Plan of Care (Signed)
  Problem: Clinical Measurements: Goal: Ability to maintain clinical measurements within normal limits will improve Outcome: Progressing Goal: Will remain free from infection Outcome: Progressing Goal: Diagnostic test results will improve Outcome: Progressing Goal: Respiratory complications will improve Outcome: Progressing Goal: Cardiovascular complication will be avoided Outcome: Progressing   Problem: Coping: Goal: Level of anxiety will decrease Outcome: Progressing   Problem: Pain Managment: Goal: General experience of comfort will improve Outcome: Progressing   Problem: Safety: Goal: Ability to remain free from injury will improve Outcome: Progressing   

## 2018-11-24 NOTE — Progress Notes (Signed)
[facility] Gastroenterology Progress Note  Marcia Saunders 85 y.o. [birth year]   Subjective: Remains on ventilator. Awake.  Objective: Vital signs: Vitals:   11/24/18 0630 11/24/18 0822  BP: 117/61 (!) 109/59  Pulse:  77  Resp: 18 20  Temp:    SpO2: 97% 93%  T 97.7  Physical Exam: Gen: lethargic, elderly, no acute distress  HEENT: anicteric sclera CV: RRR Chest: CTA B Abd: distended, firm, nontender, +BS  Lab Results: Recent Labs    11/23/18 0421 11/24/18 0447  NA 131* 134*  K 4.5 4.4  CL 98 99  CO2 25 26  GLUCOSE 126* 86  BUN 34* 47*  CREATININE 1.28* 2.01*  CALCIUM 7.2* 7.4*  MG 1.5*  --   PHOS 4.8*  --    Recent Labs    11/22/18 1640  AST 151*  ALT 77*  ALKPHOS 59  BILITOT 0.6  PROT 3.3*  ALBUMIN 1.3*   Recent Labs    11/22/18 1640  11/23/18 1933 11/24/18 0447  WBC 14.9*   < > 34.1* 25.4*  NEUTROABS 11.2*  --   --   --   HGB 4.0*   < > 8.3* 7.9*  HCT 12.8*   < > 23.0* 22.7*  MCV 80.0   < > 80.4 81.1  PLT 144*   < > 108* 106*   < > = values in this interval not displayed.      Assessment/Plan: Hemorrhagic shock of unclear etiology. No further GI bleeding noted. Hgb 7.9. Elevated INR likely multi-factorial. Noncontrast abd CT unrevealing. Suspect lower GI bleed such as diverticulosis was source of recent bleeding. D/C Protonix drip and start Protonix 40 mg IV Q 12 hours. Manage conservatively. No plans for endoscopic procedures. Will sign off. Dr. Penelope Saunders available to see this weekend if needed.   Marcia Saunders 11/24/2018, 8:41 AM  Questions please call [phone] ID: Marcia Saunders, female   DOB: 10/30/1930, 85 y.o.   MRN: [id number]

## 2018-11-24 NOTE — Progress Notes (Signed)
RT called to pt room for pt self extubation. Pt placed on NRB 15Lpm 100%. Pt sats are 100% at this time. Pt has a lot of fluid/very swollen, lung sounds are coarse at this time, no stridor noted at this time. Pt airway suctioned and ELink MD notified of pt self extubation. Pt has stated to this RT that she does not want "it" put back in. RN and RT have notified MD of pt wishes. RT will continue to monitor.

## 2018-11-24 NOTE — Progress Notes (Signed)
[facility] Progress Note Patient Name: Marcia Saunders DOB: [birth year] MRN: [id number]   Date of Service  11/24/2018  HPI/Events of Note  Notified of patient inadvertently self extubating. Copious secretions with weak cough, also with signs of fluid overload.  Patient saying she does not want to be reintubated. Currently appears comfortable on 100% NRB mask.  eICU Interventions   I spoke with granddaughter who is at bedside and daughter and son on the phone regarding reintubation if the need arises. They would want "everything done to make her survive". I informed them of patient saying she didn't want to be intubated and that survival does not equate to meaningful recovery. They will be coming tomorrow to discuss with patient regarding goals of care.  In the meantime we will try to optimize, NT suctioning as needed ordered and another dose of lasix now 60 mg IV to be given.     Intervention Category Major Interventions: Respiratory failure - evaluation and management  Judd Lien 11/24/2018, 8:03 PM

## 2018-11-24 NOTE — Progress Notes (Signed)
Assisted tele visit to patient with son and granddaughter.  Jakiera Ehler, Aliene Beams, RN

## 2018-11-24 NOTE — Progress Notes (Addendum)
NAME:  MECHELL GIRGIS, MRN:  [id number], DOB:  08/11/1930, LOS: 2 ADMISSION DATE:  11/22/2018, CONSULTATION DATE:  11/22/18 REFERRING MD:  Maryan Rued  CHIEF COMPLAINT:  Cardiac Arrest   Brief History   Marcia Saunders is a 85 y.o. female who was admitted 9/30 after cardiac arrest presumed due to hemorrhagic shock from GI bleed of unclear etiology.   Past Medical History  Unknown.  Significant Hospital Events   9/30 > admit. Got 4 units of PRBC; 2 FFP. Post arrest 10/1 seen by cards and GI 10/2 off pressors. Getting lasix. Hgb stable    Consults:  GI.  Procedures:  ETT 9/30 >  CVL pending 9/30 >  Arterial line 9/30 >  Significant Diagnostic Tests:  CXR 9/30 > atelectasis. CT abdomen and pelvis 10/1: Anasarca,Area of low-attenuation along the anterior liver margin, unclear what this is bibasilar airspace disease likely representing dependent atelectasis with effusions Echo 10/1 >  Ejection fraction 60 to 65% moderate increased; the left atrium was moderately dilated the right atrium was severely dilated there was severe tricuspid valve regurgitation there was mildly elevated pulmonary artery systolic pressures, suggested right atrial pressure 3 mmHg  Micro Data:  SARS CoV2 9/30 > negative  Antimicrobials:  Ceftriaxone 9/30 x 1.   Interim history/subjective:  Now off from norepinephrine.  Awake, interactive.   Objective:  Blood pressure 117/61, pulse 75, temperature 97.7 F (36.5 C), temperature source Oral, resp. rate 18, height 5\' 6"  (1.676 m), weight 113.7 kg, SpO2 97 %. CVP:  [12 mmHg-20 mmHg] 19 mmHg  Vent Mode: PRVC FiO2 (%):  [50 %-70 %] 60 % Set Rate:  [18 bmp] 18 bmp Vt Set:  [470 mL] 470 mL PEEP:  [8 cmH20] 8 cmH20 Plateau Pressure:  [20 cmH20-23 cmH20] 21 cmH20   Intake/Output Summary (Last 24 hours) at 11/24/2018 0740 Last data filed at 11/24/2018 0600 Gross per 24 hour  Intake 1062.56 ml  Output 90 ml  Net 972.56 ml   Filed Weights   11/22/18 2130  11/23/18 0422 11/24/18 0600  Weight: 113.4 kg 114.4 kg 113.7 kg   Physical Exam: General: This is a chronically ill-appearing elderly black female who remains on ventilatory support this morning he was awake, interactive, and appears in no distress  HEENT: Normal cephalic atraumatic orally intubated sclera are non-icteric  Pulmonary: Clear to auscultation equal chest rise on mechanically assisted breath no accessory use PEEP currently 8 cm water FiO2 60%  Cardiac: Regular rate and rhythm, does have systolic murmur audible  Abdomen: Soft, not tender.  No organomegaly  Extremities: Pitting edema bilateral lower extremities, dependent anasarca  Neuro: Awake, follows commands, generalized weakness  GU: Minimal urine output  Assessment & Plan:   S/p cardiac arrest in setting of suspected hemorrhagic shock from presumed lower GIB w/ Hematochezia Acute blood loss anemia S/p 4u PRBC in ED and 2u FFP 10/1 Hemoglobin fairly stable overnight Weaned off norepinephrine early this a.m. Plan Continue serial CBCs Transfuse for symptomatic anemia or hemoglobin less than 7 We will continue PPI infusion for now Awaiting further recommendations per GI service N.p.o.  Coagulopathy (INR 2.3) - unclear etiology, ? Underlying liver dysfunction given mild bump in transaminases.  S/p 2u FFP. Plan Repeat INR this a.m.  Acute hypoxic respiratory failure with ventilator dependence following cardiopulmonary arrest Portable chest x-ray personally reviewed: Demonstrates endotracheal tube in satisfactory position, cardiomegaly, bibasilar effusion/atelectasis better described on CT imaging Plan Continue full ventilatory support Continue to wean PEEP and FiO2 for  saturations greater than 90% VAP bundle Repeat portable chest x-ray this morning and in a.m. PAD protocol Lasix x1, CVP is 16  AKI secondary to hypoperfusion: Renal function has declined overnight I suspect this is a result of yesterday shock state  she is starting to make some urine Plan Avoid hypotension Renal dose medications Continue strict intake and output Lasix x1  Fluid and electrolyte imbalance: Hyponatremia currently improved, total body volume overload, clearing lactic acidosis Plan Lasix x1 Repeat chemistry this afternoon  Leukocytosis. ? Aspiration Plan Day 2 rocephin Ck sputum culture; low threshold to stopping   Acute metabolic encephalopathy Seems to be improving Plan Supportive care  Best Practice:  Diet: NPO Pain/Anxiety/Delirium protocol (if indicated): Fentanyl PRN / Midazolam PRN.  RASS goal 0. VAP protocol (if indicated): In place. DVT prophylaxis: SCD's only. GI prophylaxis: PPI gtt. Glucose control: SSI if glucose consistently > 180. Mobility: Bedrest. Code Status: Full - 2 sons on way to hospital to discuss further.  Remains full code in the interim. Family Communication: Updated daughter on phone 10/1(Nichole [phone]). Sons will not arrive until Friday Disposition: She remains critically ill following cardiopulmonary arrest secondary to hemorrhagic shock.  Hemoglobin seems to have stabilized she continues on Protonix infusion.  CVP suggests volume overload as this her physical exam today as she is off vasoactive drips we will diurese her hopefully this will also help with weaning  Critical care x33 minutes  Simonne Martinet ACNP-BC [facility] Pulmonary/Critical Care Pager # [phone] OR # [phone] if no answer

## 2018-11-24 NOTE — Progress Notes (Signed)
Nutrition Follow-up   RD working remotely.   DOCUMENTATION CODES:   Obesity unspecified  INTERVENTION:   Tube Feeding:  Vital High Protein at 60 ml/hr Provides 1440 kcals, 126 g of protein and 1204 mL of free water Meets 100% estimated calorie and protein needs   NUTRITION DIAGNOSIS:   Inadequate oral intake related to inability to eat as evidenced by NPO status.  Being addressed via TF   GOAL:   Patient will meet greater than or equal to 90% of their needs  Progressing  MONITOR:   TF tolerance, Weight trends, Skin, Labs, Vent status, Diet advancement, I & O's  REASON FOR ASSESSMENT:   Ventilator    ASSESSMENT:   Patient with unknown PMH. Presents this admission with cardiac arrest due to presumed hemorrhagic shock from GI bleed.  09/30 Admit, Intubated 10/01 CT abdomen unrevealing, anasarca present 10/02 Off pressors  Patient is currently intubated on ventilator support, no sedation, off pressors MV: 10 L/min Temp (24hrs), Avg:98 F (36.7 C), Min:97.6 F (36.4 C), Max:98.4 F (36.9 C)  Propofol: NONE  No further GI bleeding noted, Hgb stable  OG tube extends into stomach per chest xray  Admit weight 113.4 kg, current wt 113.7 kg, Net +1.5 L  Labs: sodium 134 (L), BUN 47, Creatinine 2.01 Meds: reviewed  Diet Order:   Diet Order            Diet NPO time specified  Diet effective now              EDUCATION NEEDS:   Not appropriate for education at this time  Skin:  Skin Assessment: Skin Integrity Issues: Skin Integrity Issues:: Other (Comment) Other: MASD- groin  Last BM:  9/30  Height:   Ht Readings from Last 1 Encounters:  11/22/18 5\' 6"  (1.676 m)    Weight:   Wt Readings from Last 1 Encounters:  11/24/18 113.7 kg    Ideal Body Weight:  59.1 kg  BMI:  Body mass index is 40.46 kg/m.  Estimated Nutritional Needs:   Kcal:  1610-9604 kcal  Protein:  118-148 grams  Fluid:  >/= 1.5 L/day   Kerman Passey MS, RDN,  LDN, CNSC [phone] Pager  [phone] Weekend/On-Call Pager

## 2018-11-24 NOTE — Plan of Care (Signed)

## 2018-11-24 NOTE — Progress Notes (Signed)
Progress Note  Patient Name: Marcia Saunders Date of Encounter: 11/24/2018  Primary Cardiologist: No primary care provider on file.  New  Subjective   Sedated, but can be woken and follows commands; intubated, FiO2 0.6.Marland Kitchen Off pressors. Oligoanuric, but a little more UO last hour. Anasarca. Stable H/H. Moderate troponin elevation trending down.  Inpatient Medications    Scheduled Meds: . chlorhexidine gluconate (MEDLINE KIT)  15 mL Mouth Rinse BID  . Chlorhexidine Gluconate Cloth  6 each Topical Daily  . furosemide  40 mg Intravenous Once  . mouth rinse  15 mL Mouth Rinse 10 times per day  . [START ON 11/26/2018] pantoprazole  40 mg Intravenous Q12H   Continuous Infusions: . sodium chloride    . cefTRIAXone (ROCEPHIN)  IV 2 g (11/24/18 0748)  . pantoprazole (PROTONIX) IVPB    . pantoprozole (PROTONIX) infusion 8 mg/hr (11/23/18 2142)   PRN Meds: Place/Maintain arterial line **AND** sodium chloride, fentaNYL (SUBLIMAZE) injection, fentaNYL (SUBLIMAZE) injection   Vital Signs    Vitals:   11/24/18 0600 11/24/18 0615 11/24/18 0630 11/24/18 0822  BP: 122/65 (!) 120/55 117/61 (!) 109/59  Pulse:    77  Resp: _0 Temp:      TempSrc:      SpO2: 93%  97% 93%  Weight: 113.7 kg     Height:        Intake/Output Summary (Last 24 hours) at 11/24/2018 0841 Last data filed at 11/24/2018 0700 Gross per 24 hour  Intake 1051.18 ml  Output 90 ml  Net 961.18 ml   Last 3 Weights 11/24/2018 11/23/2018 11/22/2018  Weight (lbs) 250 lb 10.6 oz 252 lb 3.3 oz 250 lb  Weight (kg) 113.7 kg 114.4 kg 113.4 kg      Telemetry    NSR, PACs - Personally Reviewed  ECG    No new tracing - Personally Reviewed  Physical Exam  Morbid obesity. Anasarca. GEN: No acute distress.   Neck: JVD, v waves to earlobes  Cardiac: RRR, no diastolic murmurs, rubs, or gallops.  Respiratory: Clear to auscultation bilaterally. GI: Soft, nontender, non-distended  MS: generalized 3-4+ edema; No  deformity. Neuro:  Opens eyes to voice, follows most commands Psych: N/A  Labs    High Sensitivity Troponin:   Recent Labs  Lab 11/23/18 0007 11/23/18 0421 11/23/18 1255 11/23/18 1710 11/23/18 1933  TROPONINIHS 1,721* 2,378* 4,017* 3,844* 3,802*      Chemistry Recent Labs  Lab 11/22/18 1640 11/23/18 0344 11/23/18 0421 11/24/18 0447  NA 132* 131* 131* 134*  K 4.8 4.3 4.5 4.4  CL 97*  --  98 99  CO2 17*  --  25 26  GLUCOSE 93  --  126* 86  BUN 27*  --  34* 47*  CREATININE 1.14*  --  1.28* 2.01*  CALCIUM 7.3*  --  7.2* 7.4*  PROT 3.3*  --   --   --   ALBUMIN 1.3*  --   --   --   AST 151*  --   --   --   ALT 77*  --   --   --   ALKPHOS 59  --   --   --   BILITOT 0.6  --   --   --   GFRNONAA 44*  --  38* 22*  GFRAA 50*  --  44* 25*  ANIONGAP 18*  --  8 9     Hematology Recent Labs  Lab 11/23/18 1255 11/23/18  1933 11/24/18 0447  WBC 36.7* 34.1* 25.4*  RBC 3.01* 2.86* 2.80*  HGB 8.6* 8.3* 7.9*  HCT 24.2* 23.0* 22.7*  MCV 80.4 80.4 81.1  MCH 28.6 29.0 28.2  MCHC 35.5 36.1* 34.8  RDW 17.4* 17.5* 18.4*  PLT 106* 108* 106*    BNP Recent Labs  Lab 11/22/18 1640  BNP 739.2*     DDimer  Recent Labs  Lab 11/23/18 0007  DDIMER 5.03*     Radiology    Ct Abdomen Pelvis Wo Contrast  Result Date: 11/23/2018 CLINICAL DATA:  Abdominal distension, history of rectal bleeding and hypotension. EXAM: CT ABDOMEN AND PELVIS WITHOUT CONTRAST TECHNIQUE: Multidetector CT imaging of the abdomen and pelvis was performed following the standard protocol without IV contrast. COMPARISON:  None. FINDINGS: Lower chest: Dense basilar airspace disease and small bilateral pleural effusions. Central venous access device terminates at the caval to atrial junction. Heart size markedly enlarged. Hepatobiliary: Low-density area along the anterior hepatic margin in the intralobar fissure likely represents a cyst. Assessment is markedly limited however given lack of intravenous contrast  and secondary to patient body habitus and arm position. Sludge is seen within the gallbladder. No overt biliary ductal dilation. Pancreas: Unremarkable. No pancreatic ductal dilatation or surrounding inflammatory changes. Spleen: Limited assessment of the spleen secondary to beam hardening artifact from arm position. Adrenals/Urinary Tract: Adrenal glands are normal. No signs of hydronephrosis.  Renal contours are normal. Stomach/Bowel: Enteric tube in place within the stomach. No signs of acute gastrointestinal process. The appendix is not visualized, no secondary signs to suggest appendicitis. No signs of pneumatosis or bowel obstruction. Vascular/Lymphatic: Moderate calcific atherosclerosis. Vessels not well assessed without contrast. No signs of retroperitoneal adenopathy. No signs of pelvic adenopathy. Reproductive: Post hysterectomy. Other: Diffuse intra-abdominal ascites. Density measurements of fluid are hampered by technical factors secondary to body habitus and noise on the current exam. Extensive body wall edema. Musculoskeletal: Spinal degenerative change without acute or destructive bone process. IMPRESSION: 1. Anasarca. 2. Area of low attenuation along the anterior liver margin may represent a cyst or loculated fluid, this is not clear on the current exam which is markedly limited by body habitus and technical factors. 3. Basilar airspace disease may represent pneumonia or dependent consolidation associated with effusions. 4. Marked cardiomegaly. Electronically Signed   By: Zetta Bills M.D.   On: 11/23/2018 15:12   Dg Chest Port 1 View  Result Date: 11/24/2018 CLINICAL DATA:  Acute respiratory failure, hypoxia, intubation EXAM: PORTABLE CHEST 1 VIEW COMPARISON:  Portable exam 0806 hours compared to 11/23/2018 FINDINGS: Rotated to the LEFT. Tip of endotracheal tube projects 3.5 cm above carina. LEFT jugular line with tip projecting over SVC. Nasogastric tube extends into stomach. Enlargement of  cardiac silhouette with vascular congestion. BILATERAL perihilar infiltrates question pulmonary edema. Bibasilar effusions and atelectasis. Low lung volumes. No pneumothorax or acute osseous findings. IMPRESSION: Enlargement of cardiac silhouette with pulmonary vascular congestion and mild diffuse perihilar infiltrates favoring pulmonary edema. Bibasilar pleural effusions and atelectasis. Electronically Signed   By: Lavonia Dana M.D.   On: 11/24/2018 08:37   Dg Chest Port 1 View  Result Date: 11/23/2018 CLINICAL DATA:  Respiratory failure. EXAM: PORTABLE CHEST 1 VIEW COMPARISON:  Radiographs of November 22, 2018. FINDINGS: Stable cardiomegaly. Endotracheal and nasogastric tubes are in grossly good position. Left internal jugular catheter is unchanged in position. No pneumothorax is noted. Mild left pleural effusion is noted with associated atelectasis or infiltrate. Minimal right basilar subsegmental atelectasis is noted. Bony thorax  is unremarkable. IMPRESSION: Support apparatus in grossly good position. Stable left basilar opacity as described above. Stable minimal right basilar opacity as well. Electronically Signed   By: Marijo Conception M.D.   On: 11/23/2018 08:52   Dg Chest Port 1 View  Result Date: 11/22/2018 CLINICAL DATA:  85 year old female with central line placement. EXAM: PORTABLE CHEST 1 VIEW COMPARISON:  Earlier radiograph dated 11/22/2018 FINDINGS: Interval placement of a left IJ central venous line with tip at the cavoatrial junction. No pneumothorax. Endotracheal tube with tip at the level of the carina tilting towards the right mainstem bronchus. Recommend retraction by approximately 3 cm for optimal positioning. Enteric tube extends below the diaphragm with tip beyond the inferior margin of the image. No interval change in the appearance of the lungs or cardiomediastinal silhouette since the earlier radiograph. IMPRESSION: 1. Left IJ central venous line with tip at the cavoatrial junction.  No pneumothorax. 2. Endotracheal tube with tip at the level of the carina tilting towards the right mainstem bronchus. Recommend retraction by 3 cm for optimal positioning. Electronically Signed   By: Anner Crete M.D.   On: 11/22/2018 21:22   Dg Chest Portable 1 View  Result Date: 11/22/2018 CLINICAL DATA:  Acute cardiac arrest. Intubation. EXAM: PORTABLE CHEST 1 VIEW COMPARISON:  None. FINDINGS: Patient is rotated to the left. Endotracheal tube and nasogastric tube are in appropriate position. Low lung volumes are noted. Atelectasis or infiltrate is seen in the left retrocardiac lung base. Right lung remains clear. No pneumothorax visualized. IMPRESSION: Low lung volumes, with atelectasis or infiltrate in left retrocardiac lung base. Endotracheal tube and nasogastric tube in appropriate position. Electronically Signed   By: Marlaine Hind M.D.   On: 11/22/2018 17:01    Cardiac Studies   ECHO 11/22/2018  1. Left ventricular ejection fraction, by visual estimation, is 60 to 65%. The left ventricle has normal function. Normal left ventricular size. There is moderately increased left ventricular hypertrophy.  2. Global right ventricle has mildly reduced systolic function.The right ventricular size is mildly enlarged. No increase in right ventricular wall thickness.  3. Left atrial size was moderately dilated.  4. Right atrial size was severely dilated.  5. The mitral valve is normal in structure. Trace mitral valve regurgitation. No evidence of mitral stenosis.  6. The tricuspid valve is normal in structure. Tricuspid valve regurgitation severe.  7. The aortic valve is tricuspid Aortic valve regurgitation is trivial by color flow Doppler. Mild to moderate aortic valve sclerosis/calcification without any evidence of aortic stenosis.  8. The pulmonic valve was not well visualized. Pulmonic valve regurgitation was not assessed by color flow Doppler.  9. Mildly elevated pulmonary artery systolic  pressure. 10. The inferior vena cava is normal in size with greater than 50% respiratory variability, suggesting right atrial pressure of 3 mmHg [error: the IVC is plethoric, albeit intubated patient; prominent systolic reversal of hepatic vein flow, suspect RA pressure around 20 - my note, MCr] 11. The interatrial septum was not well visualized.   Patient Profile     85 y.o. female w morbid obesity, moderate nonobstructive CAD by cath 2009, HTN presents 11/22/2018 with posthemorrhagic shock due to lower GI bleed with severe anemia, demand NSTEMI, anasarca due to right heart failure and severe hypoalbuminemia, acute oligoanuric renal failure, coagulopathy.   Assessment & Plan    1. Post hemorrhagic shock: resolved, but will be dealing with consequences of end-organ injury.  Very poor urine output, high concern for post shock ATN  in the next few days. Hemoglobin appears to be stable around 8. No overt GI blood loss. 2. Demand NSTEMI:  Suspect demand ischemia/injury in the setting of critical anemia and shock. ECG does not support a diagnosis of major acute coronary event and there are no LV wall motion abnormalities on echo. 3. Right heart failure/anasarca: Precedes acute illness. Hypoalbuminemia (consider nephrotic sd. or liver disease) is compounding the swelling. Echo shows evidence of chronic cor pulmonale, with high level of suspicion for sleep disordered breathing and/or obesity-hypoventilation syndrome. She has (at least) mild PAH and severe TR (suspect secondary). If renal function fails to recover, will need HD/CRRT for volume removal. 4. Abnormal ECG: Marked discrepancy between LVH on echo and low voltage on ECG raises possibility of amyloidosis, but the RV appearance is atypical for that diagnosis and Doppler findings are not really consistent with restrictive cardiomyopathy. Low voltage may be explained by anasarca and obesity.  5. "Atrial fibrillation": I do not think this is a correct  diagnosis. I think P waves are seen on both ECGs and PACs make the rhythm irregular. During the echo there is distinct atrial contraction c/w sinus rhythm.  6. Coagulopathy: Etiology uncertain.  Consider liver dysfunction due to passive congestion from right heart failure, although CT does not show evidence of cirrhosis.  Severe hypoalbuminemia.      For questions or updates, please contact [place] Please consult [email or web address] for contact info under        Signed, Sanda Klein, MD  11/24/2018, 8:41 AM

## 2018-11-25 ENCOUNTER — Other Ambulatory Visit: Payer: Self-pay

## 2018-11-25 LAB — POCT I-STAT 7, (LYTES, BLD GAS, ICA,H+H)
Acid-Base Excess: 1 mmol/L (ref 0.0–2.0)
Bicarbonate: 25.7 mmol/L (ref 20.0–28.0)
Calcium, Ion: 1.15 mmol/L (ref 1.15–1.40)
HCT: 23 % — ABNORMAL LOW (ref 36.0–46.0)
Hemoglobin: 7.8 g/dL — ABNORMAL LOW (ref 12.0–15.0)
O2 Saturation: 96 %
Patient temperature: 97.6
Potassium: 3.8 mmol/L (ref 3.5–5.1)
Sodium: 133 mmol/L — ABNORMAL LOW (ref 135–145)
TCO2: 27 mmol/L (ref 22–32)
pCO2 arterial: 41.8 mmHg (ref 32.0–48.0)
pH, Arterial: 7.395 (ref 7.350–7.450)
pO2, Arterial: 78 mmHg — ABNORMAL LOW (ref 83.0–108.0)

## 2018-11-25 LAB — BASIC METABOLIC PANEL
Anion gap: 9 (ref 5–15)
Anion gap: 11 (ref 5–15)
BUN: 54 mg/dL — ABNORMAL HIGH (ref 8–23)
BUN: 57 mg/dL — ABNORMAL HIGH (ref 8–23)
CO2: 25 mmol/L (ref 22–32)
CO2: 24 mmol/L (ref 22–32)
Calcium: 7.8 mg/dL — ABNORMAL LOW (ref 8.9–10.3)
Calcium: 7.7 mg/dL — ABNORMAL LOW (ref 8.9–10.3)
Chloride: 100 mmol/L (ref 98–111)
Chloride: 97 mmol/L — ABNORMAL LOW (ref 98–111)
Creatinine, Ser: 2.48 mg/dL — ABNORMAL HIGH (ref 0.44–1.00)
Creatinine, Ser: 2.61 mg/dL — ABNORMAL HIGH (ref 0.44–1.00)
GFR calc Af Amer: 20 mL/min — ABNORMAL LOW (ref >60)
GFR calc Af Amer: 19 mL/min — ABNORMAL LOW (ref >60)
GFR calc non Af Amer: 17 mL/min — ABNORMAL LOW (ref >60)
GFR calc non Af Amer: 16 mL/min — ABNORMAL LOW (ref >60)
Glucose, Bld: 75 mg/dL (ref 70–99)
Glucose, Bld: 107 mg/dL — ABNORMAL HIGH (ref 70–99)
Potassium: 3.8 mmol/L (ref 3.5–5.1)
Potassium: 3.6 mmol/L (ref 3.5–5.1)
Sodium: 134 mmol/L — ABNORMAL LOW (ref 135–145)
Sodium: 132 mmol/L — ABNORMAL LOW (ref 135–145)

## 2018-11-25 LAB — GLUCOSE, CAPILLARY
Glucose-Capillary: 100 mg/dL — ABNORMAL HIGH (ref 70–99)
Glucose-Capillary: 64 mg/dL — ABNORMAL LOW (ref 70–99)
Glucose-Capillary: 69 mg/dL — ABNORMAL LOW (ref 70–99)
Glucose-Capillary: 69 mg/dL — ABNORMAL LOW (ref 70–99)
Glucose-Capillary: 78 mg/dL (ref 70–99)
Glucose-Capillary: 96 mg/dL (ref 70–99)

## 2018-11-25 LAB — MAGNESIUM
Magnesium: 1.8 mg/dL (ref 1.7–2.4)
Magnesium: 1.9 mg/dL (ref 1.7–2.4)

## 2018-11-25 LAB — PROTIME-INR
INR: 1.3 — ABNORMAL HIGH (ref 0.8–1.2)
Prothrombin Time: 15.9 seconds — ABNORMAL HIGH (ref 11.4–15.2)

## 2018-11-25 LAB — PHOSPHORUS
Phosphorus: 4.9 mg/dL — ABNORMAL HIGH (ref 2.5–4.6)
Phosphorus: 5 mg/dL — ABNORMAL HIGH (ref 2.5–4.6)

## 2018-11-25 MED ORDER — DEXTROSE 50 % IV SOLN
12.5000 g | Freq: Once | INTRAVENOUS | Status: AC
  Administered 2018-11-25: 05:00:00 12.5 g via INTRAVENOUS

## 2018-11-25 MED ORDER — FENTANYL CITRATE (PF) 100 MCG/2ML IJ SOLN
12.5000 ug | INTRAMUSCULAR | Status: DC | PRN
  Administered 2018-11-25 – 2018-11-26 (×2): 12.5 ug via INTRAVENOUS
  Filled 2018-11-25: qty 2

## 2018-11-25 MED ORDER — DEXTROSE 50 % IV SOLN
INTRAVENOUS | Status: AC
  Filled 2018-11-25: qty 50

## 2018-11-25 MED ORDER — ACETAMINOPHEN 325 MG PO TABS
650.0000 mg | ORAL_TABLET | Freq: Four times a day (QID) | ORAL | Status: DC | PRN
  Administered 2018-11-26: 650 mg via ORAL
  Filled 2018-11-25: qty 2

## 2018-11-25 MED ORDER — LIDOCAINE 5 % EX PTCH
1.0000 | MEDICATED_PATCH | CUTANEOUS | Status: DC
  Administered 2018-11-25 – 2018-11-29 (×4): 1 via TRANSDERMAL
  Filled 2018-11-25 (×6): qty 1

## 2018-11-25 MED ORDER — FENTANYL CITRATE (PF) 100 MCG/2ML IJ SOLN
INTRAMUSCULAR | Status: AC
  Administered 2018-11-25: 12.5 ug via INTRAVENOUS
  Filled 2018-11-25: qty 2

## 2018-11-25 MED ORDER — METOPROLOL TARTRATE 5 MG/5ML IV SOLN
2.5000 mg | INTRAVENOUS | Status: DC | PRN
  Administered 2018-11-25 – 2018-11-28 (×2): 2.5 mg via INTRAVENOUS
  Filled 2018-11-25 (×2): qty 5

## 2018-11-25 NOTE — Progress Notes (Signed)
Assisted tele visit to patient with family member.  Verginia Toohey M, RN  

## 2018-11-25 NOTE — Evaluation (Signed)
Physical Therapy Evaluation Patient Details Name: ROSMERY DUGGIN MRN: [id number] DOB: 01/09/1931 Today's Date: 11/25/2018   History of Present Illness  85 y.o. female who was admitted 9/30 after cardiac arrest presumed due to hemorrhagic shock from GI bleed. PMH: unknown  Clinical Impression  Pt admitted with above diagnosis. Pt was only able to perform some exercises in bed and needing a lot of assist due to swelling in UE and LEs.  ATtempted egress of bed but HR to 158 bpm with pt having chest pain therefore terminated that activity. Will follow acutely.  Pt currently with functional limitations due to the deficits listed below (see PT Problem List). Pt will benefit from skilled PT to increase their independence and safety with mobility to allow discharge to the venue listed below.      Follow Up Recommendations SNF;Supervision/Assistance - 24 hour    Equipment Recommendations  Other (comment)(TBA)    Recommendations for Other Services       Precautions / Restrictions Precautions Precautions: Fall Restrictions Weight Bearing Restrictions: No      Mobility  Bed Mobility Overal bed mobility: Needs Assistance             General bed mobility comments: Progressively inclined bed to full chair position and tried to get pt to pull herself forward or lean forward however pt could not pull forward on rails due to swelling in hands vs. weakness.  Pt c/o chest pain and HR to 158 bpm from 104 bpm therefore laid pt back down and positonined in chair position. Notified nurse.   Transfers                 General transfer comment: unable at this time  Ambulation/Gait             General Gait Details: unable at this time  Stairs            Wheelchair Mobility    Modified Rankin (Stroke Patients Only)       Balance Overall balance assessment: Needs assistance Sitting-balance support: Bilateral upper extremity supported;Feet unsupported Sitting balance-Leahy  Scale: Zero Sitting balance - Comments: cannot sit without support                                     Pertinent Vitals/Pain Pain Assessment: Faces Faces Pain Scale: Hurts whole lot Pain Location: generalized Pain Descriptors / Indicators: Aching;Grimacing;Guarding;Discomfort Pain Intervention(s): Limited activity within patient's tolerance;Monitored during session;Repositioned    Home Living Family/patient expects to be discharged to:: Private residence Living Arrangements: Children(daughter) Available Help at Discharge: Family;Available 24 hours/day Type of Home: House Home Access: Level entry         Additional Comments: unable to get home information as it was clear pt was confused/delirious    Prior Function           Comments: Cannot get home info     Hand Dominance        Extremity/Trunk Assessment   Upper Extremity Assessment Upper Extremity Assessment: Defer to OT evaluation;RUE deficits/detail;LUE deficits/detail RUE Deficits / Details: Vivien Presto noted with extensive swelling entire UE LUE Deficits / Details: Vivien Presto noted with extensive swelling entire UE     Lower Extremity Assessment Lower Extremity Assessment: RLE deficits/detail;LLE deficits/detail RLE Deficits / Details: Vivien Presto noted with extensive swelling entire LE LLE Deficits / Details: Vivien Presto noted with extensive swelling entire LE    Cervical / Trunk Assessment  Cervical / Trunk Assessment: Kyphotic  Communication   Communication: No difficulties(speaks in low tone)  Cognition Arousal/Alertness: Awake/alert Behavior During Therapy: Anxious;Restless Overall Cognitive Status: Impaired/Different from baseline Area of Impairment: Orientation;Attention;Safety/judgement;Following commands;Memory;Awareness;Problem solving                 Orientation Level: Disoriented to;Place;Time;Situation(Pt states she is in [place]) Current Attention Level: Focused Memory:  Decreased short-term memory;Decreased recall of precautions Following Commands: Follows one step commands inconsistently Safety/Judgement: Decreased awareness of safety;Decreased awareness of deficits   Problem Solving: Slow processing;Decreased initiation;Difficulty sequencing;Requires verbal cues;Requires tactile cues General Comments: Pt delirious.  Thought it was nighttime, wanted to call her daughter and had been informed daughter was coming to see her at 3:30 pm.       General Comments General comments (skin integrity, edema, etc.): Weeping all 4 extremities noted with profuse swelling.     Exercises General Exercises - Upper Extremity Shoulder Flexion: Both;5 reps;Supine;AAROM Elbow Flexion: AAROM;Both;5 reps;Supine Elbow Extension: AAROM;Both;5 reps;Supine Wrist Flexion: AAROM;Both;5 reps;Supine General Exercises - Lower Extremity Ankle Circles/Pumps: Both;5 reps;Supine;AAROM Heel Slides: AAROM;Both;5 reps;Supine   Assessment/Plan    PT Assessment Patient needs continued PT services  PT Problem List Decreased strength;Decreased range of motion;Decreased activity tolerance;Decreased balance;Decreased mobility;Decreased knowledge of use of DME;Decreased safety awareness;Decreased knowledge of precautions;Cardiopulmonary status limiting activity;Pain;Decreased skin integrity;Obesity       PT Treatment Interventions Gait training;Therapeutic activities;Functional mobility training;DME instruction;Therapeutic exercise;Balance training;Patient/family education    PT Goals (Current goals can be found in the Care Plan section)  Acute Rehab PT Goals Patient Stated Goal: unable to state PT Goal Formulation: Patient unable to participate in goal setting Time For Goal Achievement: 12/09/18 Potential to Achieve Goals: Fair    Frequency Min 2X/week   Barriers to discharge        Co-evaluation               AM-PAC PT "6 Clicks" Mobility  Outcome Measure Help needed  turning from your back to your side while in a flat bed without using bedrails?: Total Help needed moving from lying on your back to sitting on the side of a flat bed without using bedrails?: Total Help needed moving to and from a bed to a chair (including a wheelchair)?: Total Help needed standing up from a chair using your arms (e.g., wheelchair or bedside chair)?: Total Help needed to walk in hospital room?: Total Help needed climbing 3-5 steps with a railing? : Total 6 Click Score: 6    End of Session Equipment Utilized During Treatment: Gait belt;Oxygen Activity Tolerance: Patient limited by fatigue;Patient limited by pain Patient left: in bed;with call bell/phone within reach;with nursing/sitter in room Nurse Communication: Mobility status;Need for lift equipment PT Visit Diagnosis: Muscle weakness (generalized) (M62.81);Pain Pain - part of body: (generalized)    Time: 0539-7673 PT Time Calculation (min) (ACUTE ONLY): 22 min   Charges:   PT Evaluation $PT Eval Moderate Complexity: [place] Pager:  [phone]  Office:  [phone]    Denice Paradise 11/25/2018, 3:48 PM

## 2018-11-25 NOTE — Progress Notes (Signed)
RT obtained ABG on pt with the following results. Pt self extubated 10/2 @ 2003 and placed on NRB then changed to a [place] 6Lpm w/humidity. ABG was obtained on  6 Lpm. Pt respiratory status is stable at this time. RT will continue to monitor.   Results for Marcia Saunders, Marcia Saunders (MRN [id number]) as of 11/25/2018 04:56  Ref. Range 11/25/2018 04:47  Sample type Unknown ARTERIAL  pH, Arterial Latest Ref Range: 7.350 - 7.450  7.395  pCO2 arterial Latest Ref Range: 32.0 - 48.0 mmHg 41.8  pO2, Arterial Latest Ref Range: 83.0 - 108.0 mmHg 78.0 (L)  TCO2 Latest Ref Range: 22 - 32 mmol/L 27  Acid-Base Excess Latest Ref Range: 0.0 - 2.0 mmol/L 1.0  Bicarbonate Latest Ref Range: 20.0 - 28.0 mmol/L 25.7  O2 Saturation Latest Units: % 96.0  Patient temperature Unknown 97.6 F  Collection site Unknown RADIAL, ALLEN'S TEST ACCEPTABLE

## 2018-11-25 NOTE — Evaluation (Signed)
Clinical/Bedside Swallow Evaluation Patient Details  Name: Marcia Saunders MRN: [id number] Date of Birth: 1930/07/02  Today's Date: 11/25/2018 Time: SLP Start Time (ACUTE ONLY): 1055 SLP Stop Time (ACUTE ONLY): 1125 SLP Time Calculation (min) (ACUTE ONLY): 30 min  Past Medical History: No past medical history on file. Past Surgical History: Unknown  HPI:  85 y.o. female who was admitted 9/30 after cardiac arrest presumed due to hemorrhagic shock from GI bleed. PMH: unknown  Assessment / Plan / Recommendation Clinical Impression  Pt seen at bedside to assess readiness for po intake, s/p self-extubation. Pt awake and cooperative. CN unremarkable. She reports no history of difficulty swallowing, but some solids require extra time to chew due to poor dentition and no dentures. After oral care with suction, pt accepted trials of ice chips, thin liquid, puree, and solid consistencies. All textures were tolerated well, with no obvious oral issues, and no overt s/s aspiration. Pt passed Yale swallow screen, indicating low risk for aspiration. At this time, will begin dys 2 (finely chopped - for energy conservation) diet with thin liquids. Pt will need 1:1 assistance with feeding. Recommend meds be given with puree - crush large pills (all pills if needed). Safe swallow precautions posted at [facility] and discussed with RN. SLP will follow for assessment of diet tolerance and to continue education.   SLP Visit Diagnosis: Dysphagia, unspecified (R13.10)    Aspiration Risk  Mild aspiration risk    Diet Recommendation Dysphagia 2 (Fine chop);Thin liquid   Liquid Administration via: Cup;Straw Medication Administration: Whole meds with puree Supervision: Staff to assist with self feeding;Full supervision/cueing for compensatory strategies Compensations: Minimize environmental distractions;Slow rate;Small sips/bites Postural Changes: Seated upright at 90 degrees;Remain upright for at least 30 minutes after  po intake    Other  Recommendations Oral Care Recommendations: Staff/trained caregiver to provide oral care;Oral care QID Other Recommendations: Have oral suction available   Follow up Recommendations Other (comment)(TBD)      Frequency and Duration min 2x/week  1 week;2 weeks       Prognosis Prognosis for Safe Diet Advancement: Good      Swallow Study   General Date of Onset: 11/22/18 HPI: 85 y.o. female who was admitted 9/30 after cardiac arrest presumed due to hemorrhagic shock from GI bleed Type of Study: Bedside Swallow Evaluation Previous Swallow Assessment: no previous ST intervention Diet Prior to this Study: NPO Temperature Spikes Noted: No Respiratory Status: Nasal cannula History of Recent Intubation: Yes Length of Intubations (days): 2 days Date extubated: 11/24/18(self extubated) Behavior/Cognition: Alert;Cooperative;Pleasant mood;Confused;Distractible Oral Cavity Assessment: Within Functional Limits Oral Care Completed by SLP: Yes Oral Cavity - Dentition: Missing dentition Vision: Functional for self-feeding Self-Feeding Abilities: Total assist Patient Positioning: Upright in bed Baseline Vocal Quality: Low vocal intensity Volitional Cough: Weak Volitional Swallow: Able to elicit    Oral/Motor/Sensory Function Overall Oral Motor/Sensory Function: Generalized oral weakness   Ice Chips Ice chips: Within functional limits Presentation: Spoon   Thin Liquid Thin Liquid: Within functional limits Presentation: Straw    Nectar Thick Nectar Thick Liquid: Not tested   Honey Thick Honey Thick Liquid: Not tested   Puree Puree: Within functional limits Presentation: Spoon   Solid     Solid: Within functional limits     Celia B. Quentin Ore, [facility], [place] Speech Language Pathologist Office: [phone] Pager: [phone]  Shonna Chock 11/25/2018,11:30 AM

## 2018-11-25 NOTE — Plan of Care (Addendum)
Notified that the Marcia Saunders has periodic episodes of tachycardia, sometimes up to a rate of 150. No reported history of atrial fibrillation, but dilated left and right atria on echocardiogram.  Also complaining of chest pain, not always associated with tachycardia.  Has previously received chest compressions.  Has no pain medications ordered.  Tylenol every 6 hours as needed for pain or fever Fentanyl for severe pain Lidocaine patches to apply to central chest EKG during next episode of tachycardia to determine arrhythmia  Julian Hy, DO 11/25/18 4:11 PM [facility]   Telemetry with irregular, low voltage QRS without obvious p-waves. HR varies 95-125. Obtaining EKG to confirm suspicion for atrial fibrillation. Will order metoprolol IV for sustain HR >120.   Julian Hy, DO 11/25/18 4:27 PM [facility]

## 2018-11-25 NOTE — Progress Notes (Signed)
NAME:  Marcia Saunders, MRN:  [id number], DOB:  08-31-1930, LOS: 3 ADMISSION DATE:  11/22/2018, CONSULTATION DATE:  11/22/18 REFERRING MD:  Anitra Lauth  CHIEF COMPLAINT:  Cardiac Arrest   Brief History   Marcia Saunders is a 85 y.o. female who was admitted 9/30 after cardiac arrest presumed due to hemorrhagic shock from GI bleed of unclear etiology.  Past Medical History  Unknown.  Significant Hospital Events   9/30 > admit. Got 4 units of PRBC; 2 FFP. Post arrest 10/1 seen by cards and GI 10/2 off pressors. Getting lasix. Hgb stable    Consults:  GI.  Procedures:  ETT 9/30 > 10/2 CVL pending 9/30 >  Arterial line 9/30 >  Significant Diagnostic Tests:  CXR 9/30 > atelectasis. CT abdomen and pelvis 10/1: Anasarca,Area of low-attenuation along the anterior liver margin, unclear what this is bibasilar airspace disease likely representing dependent atelectasis with effusions Echo 10/1 >  Ejection fraction 60 to 65% moderate increased; the left atrium was moderately dilated the right atrium was severely dilated there was severe tricuspid valve regurgitation there was mildly elevated pulmonary artery systolic pressures, suggested right atrial pressure 3 mmHg  Micro Data:  SARS CoV2 9/30 > negative  Antimicrobials:  Ceftriaxone 9/30 x 1.   Interim history/subjective:  Self-extubated yesterday. Tolerating [place] this morning. Interacts appropriately.  Objective:  Blood pressure 130/72, pulse 95, temperature 97.6 F (36.4 C), temperature source Oral, resp. rate 15, height 5\' 6"  (1.676 m), weight 109.6 kg, SpO2 95 %. CVP:  [15 mmHg-18 mmHg] 17 mmHg  Vent Mode: PRVC FiO2 (%):  [50 %-100 %] 100 % Set Rate:  [18 bmp] 18 bmp Vt Set:  [470 mL] 470 mL PEEP:  [8 cmH20] 8 cmH20 Plateau Pressure:  [20 cmH20-22 cmH20] 21 cmH20   Intake/Output Summary (Last 24 hours) at 11/25/2018 0718 Last data filed at 11/25/2018 0600 Gross per 24 hour  Intake 165.22 ml  Output 1235 ml  Net -1069.78 ml    Filed Weights   11/23/18 0422 11/24/18 0600 11/25/18 0500  Weight: 114.4 kg 113.7 kg 109.6 kg   Physical Exam: General: Chronically ill-appearing, no acute distress HENT: [place], AT, OP clear, MMM Eyes: EOMI, no scleral icterus Respiratory: Clear to auscultation bilaterally.  No crackles, wheezing or rales Cardiovascular: RRR, -M/R/G, no JVD GI: BS+, soft, nontender Extremities:1+ pitting edema in lower extremities,-tenderness Neuro: AAO x4, CNII-XII grossly intact Psych: Normal mood, normal affect GU: Foley in place  Assessment & Plan:   S/p cardiac arrest in setting of suspected hemorrhagic shock from presumed lower GIB w/ Hematochezia Acute blood loss anemia S/p 4u PRBC in ED and 2u FFP 10/1 Plan Daily CBC Transfuse for symptomatic anemia or hemoglobin less than 7 Appreciate GI input. No plan for endoscopic evaluation at this time PPI BID  Coagulopathy (INR 2.3) - unclear etiology. Improving. Susp? Underlying liver dysfunction given mild bump in transaminases.  S/p 2u FFP.  Plan Daily INR  Acute hypoxic respiratory failure secondary pulmonary edema, ?aspiration Self-extubated 10/2 Plan Continue to wean supplemental O2 for goal SpO2 >88% Continue Rocephin for five day course Monitor respiratory status  AKI secondary to hypoperfusion Initially improving. Worsened after aggressive diuresis yesterday Plan Monitor UOP Will give additional lasix if output is poor  Best Practice:  Diet: Advance diet as tolerate Pain/Anxiety/Delirium protocol (if indicated):-- VAP protocol (if indicated): -- DVT prophylaxis: SCD's only. GI prophylaxis: PPI Glucose control: SSI if glucose consistently > 180. Mobility: PT Code Status: Patient does not wish  to be reintubated. When discussing chest compressions, patient silent. Will continue [place] discussion Family Communication: Family updated overnight about self-extubation. Left message with [name], daughter, regarding update that her mother  is stable and well right now on 10/3. ([phone]). Disposition: ICU this morning. If stable, transfer to floor tomorrow   The patient requires high complexity decision making for assessment and support, frequent evaluation and titration of therapies, application of advanced monitoring technologies and extensive interpretation of multiple databases.   Critical Care Time devoted to patient care services described in this note is 38 Minutes.  Rodman Pickle, M.D. [facility]/Critical Care Medicine 11/25/2018 7:30 AM  Pager: [phone] After hours pager: [phone]

## 2018-11-26 DIAGNOSIS — R5381 Other malaise: Secondary | ICD-10-CM

## 2018-11-26 LAB — TYPE AND SCREEN
ABO/RH(D): A POS
Antibody Screen: NEGATIVE
Unit division: 0
Unit division: 0
Unit division: 0
Unit division: 0
Unit division: 0
Unit division: 0
Unit division: 0
Unit division: 0

## 2018-11-26 LAB — GLUCOSE, CAPILLARY
Glucose-Capillary: 92 mg/dL (ref 70–99)
Glucose-Capillary: 114 mg/dL — ABNORMAL HIGH (ref 70–99)
Glucose-Capillary: 106 mg/dL — ABNORMAL HIGH (ref 70–99)
Glucose-Capillary: 63 mg/dL — ABNORMAL LOW (ref 70–99)
Glucose-Capillary: 82 mg/dL (ref 70–99)
Glucose-Capillary: 77 mg/dL (ref 70–99)
Glucose-Capillary: 91 mg/dL (ref 70–99)
Glucose-Capillary: 102 mg/dL — ABNORMAL HIGH (ref 70–99)

## 2018-11-26 LAB — BPAM RBC
Blood Product Expiration Date: 202010252359
Blood Product Expiration Date: 202010252359
Blood Product Expiration Date: 202010262359
Blood Product Expiration Date: 202010262359
Blood Product Expiration Date: 202010262359
Blood Product Expiration Date: 202010262359
Blood Product Expiration Date: 202010262359
Blood Product Expiration Date: 202010262359
ISSUE DATE / TIME: 202009301641
ISSUE DATE / TIME: 202009301648
ISSUE DATE / TIME: 202009301820
ISSUE DATE / TIME: 202009301820
ISSUE DATE / TIME: 202009301831
ISSUE DATE / TIME: 202009301831
Unit Type and Rh: 5100
Unit Type and Rh: 5100
Unit Type and Rh: 6200
Unit Type and Rh: 6200
Unit Type and Rh: 6200
Unit Type and Rh: 6200
Unit Type and Rh: 6200
Unit Type and Rh: 6200

## 2018-11-26 LAB — CBC
HCT: 23.5 % — ABNORMAL LOW (ref 36.0–46.0)
Hemoglobin: 7.7 g/dL — ABNORMAL LOW (ref 12.0–15.0)
MCH: 28.2 pg (ref 26.0–34.0)
MCHC: 32.8 g/dL (ref 30.0–36.0)
MCV: 86.1 fL (ref 80.0–100.0)
Platelets: 123 10*3/uL — ABNORMAL LOW (ref 150–400)
RBC: 2.73 MIL/uL — ABNORMAL LOW (ref 3.87–5.11)
RDW: 19.6 % — ABNORMAL HIGH (ref 11.5–15.5)
WBC: 14.3 10*3/uL — ABNORMAL HIGH (ref 4.0–10.5)
nRBC: 0 % (ref 0.0–0.2)

## 2018-11-26 LAB — BASIC METABOLIC PANEL
Anion gap: 13 (ref 5–15)
BUN: 63 mg/dL — ABNORMAL HIGH (ref 8–23)
CO2: 25 mmol/L (ref 22–32)
Calcium: 7.9 mg/dL — ABNORMAL LOW (ref 8.9–10.3)
Chloride: 97 mmol/L — ABNORMAL LOW (ref 98–111)
Creatinine, Ser: 2.92 mg/dL — ABNORMAL HIGH (ref 0.44–1.00)
GFR calc Af Amer: 16 mL/min — ABNORMAL LOW (ref >60)
GFR calc non Af Amer: 14 mL/min — ABNORMAL LOW (ref >60)
Glucose, Bld: 105 mg/dL — ABNORMAL HIGH (ref 70–99)
Potassium: 3.9 mmol/L (ref 3.5–5.1)
Sodium: 135 mmol/L (ref 135–145)

## 2018-11-26 MED ORDER — ORAL CARE MOUTH RINSE
15.0000 mL | Freq: Two times a day (BID) | OROMUCOSAL | Status: DC
  Administered 2018-11-26 – 2018-12-01 (×7): 15 mL via OROMUCOSAL

## 2018-11-26 MED ORDER — POTASSIUM CHLORIDE CRYS ER 20 MEQ PO TBCR
40.0000 meq | EXTENDED_RELEASE_TABLET | Freq: Once | ORAL | Status: AC
  Administered 2018-11-26: 40 meq via ORAL
  Filled 2018-11-26: qty 2

## 2018-11-26 MED ORDER — FUROSEMIDE 10 MG/ML IJ SOLN
40.0000 mg | Freq: Three times a day (TID) | INTRAMUSCULAR | Status: AC
  Administered 2018-11-26 (×2): 40 mg via INTRAVENOUS
  Filled 2018-11-26 (×2): qty 4

## 2018-11-26 NOTE — Progress Notes (Signed)
Hypoglycemic Event  CBG: 63  Treatment: 4 oz juice/soda  Symptoms: None  Follow-up CBG: Time:0900 CBG Result:82  Possible Reasons for Event: Unknown      Demica Zook Romilda Garret

## 2018-11-26 NOTE — Progress Notes (Signed)
   NAME:  Marcia Saunders, MRN:  [id number], DOB:  [birth year], LOS: 4 ADMISSION DATE:  11/22/2018, CONSULTATION DATE:  11/22/18 REFERRING MD:  Maryan Rued  CHIEF COMPLAINT:  Cardiac Arrest   Brief History   Marcia Saunders is a 85 y.o. female who was admitted 9/30 after cardiac arrest presumed due to hemorrhagic shock from GI bleed of unclear etiology.  Past Medical History  Unknown.  Significant Hospital Events   9/30 > admit. Got 4 units of PRBC; 2 FFP. Post arrest 10/1 seen by cards and GI 10/2 off pressors. Getting lasix. Hgb stable    Consults:  GI.  Procedures:  ETT 9/30 > 10/2 CVL pending 9/30 >  Arterial line 9/30 >  Significant Diagnostic Tests:  CXR 9/30 > atelectasis. CT abdomen and pelvis 10/1: Anasarca,Area of low-attenuation along the anterior liver margin, unclear what this is bibasilar airspace disease likely representing dependent atelectasis with effusions Echo 10/1 >  Ejection fraction 60 to 65% moderate increased; the left atrium was moderately dilated the right atrium was severely dilated there was severe tricuspid valve regurgitation there was mildly elevated pulmonary artery systolic pressures, suggested right atrial pressure 3 mmHg  Micro Data:  SARS CoV2 9/30 > negative  Antimicrobials:  Ceftriaxone 9/30 x 1.   Interim history/subjective:  Extubated and tolerated it well  Objective:  Blood pressure 130/86, pulse 86, temperature (!) 96.4 F (35.8 C), temperature source Axillary, resp. rate (!) 25, height 5\' 6"  (1.676 m), weight 109.9 kg, SpO2 97 %. CVP:  [24 mmHg-26 mmHg] 26 mmHg      Intake/Output Summary (Last 24 hours) at 11/26/2018 0818 Last data filed at 11/26/2018 0600 Gross per 24 hour  Intake -  Output 860 ml  Net -860 ml   Filed Weights   11/24/18 0600 11/25/18 0500 11/26/18 0500  Weight: 113.7 kg 109.6 kg 109.9 kg   Physical Exam: General: Chronically ill appearing female, NAD HENT: [place]/AT, PERRL, EOM-I and MMM Respiratory:  Diminished diffusely Cardiovascular: RRR, Nl S1/S2 and -M/R/G GI: Soft, NT, ND and +BS Extremities:1+ pitting edema in lower extremities,-tenderness Neuro: AAO x4, CNII-XII grossly intact Skin: Intact  I reviewed CXR myself, mild pulmonary edema noted  Assessment & Plan:   S/p cardiac arrest in setting of suspected hemorrhagic shock from presumed lower GIB w/ Hematochezia Acute blood loss anemia S/p 4u PRBC in ED and 2u FFP 10/1 Plan CBC in AM Transfuse for symptomatic anemia or hemoglobin less than 7 Appreciate GI input. No plan for endoscopic evaluation at this time PPI BID  Coagulopathy (INR 2.3) - unclear etiology. Improving. Susp? Underlying liver dysfunction given mild bump in transaminases.  S/p 2u FFP.  Plan INR in AM Transfuse as needed  Acute hypoxic respiratory failure secondary pulmonary edema, ?aspiration Self-extubated 10/2 Plan Titrate O2 for sat of 88-92% Continue Rocephin for five day course Monitor respiratory status Monitor for airway protection concerns IS Flutter valve Ambulate  AKI secondary to hypoperfusion Initially improving. Worsened after aggressive diuresis yesterday Plan Lasix 40 mg IV x2 doses KCl 40 meq PO x1 BMET in AM  Discussed with bedside RN and TRH-MD  Transfer out of the ICU and to [facility] service with PCCM off 10/5  Rush Farmer, M.D. [facility] Pulmonary/Critical Care Medicine. Pager: [phone]. After hours pager: [phone].

## 2018-11-27 DIAGNOSIS — R778 Other specified abnormalities of plasma proteins: Secondary | ICD-10-CM

## 2018-11-27 DIAGNOSIS — I50813 Acute on chronic right heart failure: Secondary | ICD-10-CM

## 2018-11-27 LAB — BASIC METABOLIC PANEL
Anion gap: 12 (ref 5–15)
BUN: 68 mg/dL — ABNORMAL HIGH (ref 8–23)
CO2: 25 mmol/L (ref 22–32)
Calcium: 8.1 mg/dL — ABNORMAL LOW (ref 8.9–10.3)
Chloride: 99 mmol/L (ref 98–111)
Creatinine, Ser: 3.18 mg/dL — ABNORMAL HIGH (ref 0.44–1.00)
GFR calc Af Amer: 15 mL/min — ABNORMAL LOW (ref >60)
GFR calc non Af Amer: 13 mL/min — ABNORMAL LOW (ref >60)
Glucose, Bld: 107 mg/dL — ABNORMAL HIGH (ref 70–99)
Potassium: 4.7 mmol/L (ref 3.5–5.1)
Sodium: 136 mmol/L (ref 135–145)

## 2018-11-27 LAB — CBC
HCT: 24.5 % — ABNORMAL LOW (ref 36.0–46.0)
HCT: 24.1 % — ABNORMAL LOW (ref 36.0–46.0)
Hemoglobin: 7.8 g/dL — ABNORMAL LOW (ref 12.0–15.0)
Hemoglobin: 7.9 g/dL — ABNORMAL LOW (ref 12.0–15.0)
MCH: 27.4 pg (ref 26.0–34.0)
MCH: 28.4 pg (ref 26.0–34.0)
MCHC: 31.8 g/dL (ref 30.0–36.0)
MCHC: 32.8 g/dL (ref 30.0–36.0)
MCV: 86 fL (ref 80.0–100.0)
MCV: 86.7 fL (ref 80.0–100.0)
Platelets: 131 10*3/uL — ABNORMAL LOW (ref 150–400)
Platelets: 142 10*3/uL — ABNORMAL LOW (ref 150–400)
RBC: 2.85 MIL/uL — ABNORMAL LOW (ref 3.87–5.11)
RBC: 2.78 MIL/uL — ABNORMAL LOW (ref 3.87–5.11)
RDW: 19.7 % — ABNORMAL HIGH (ref 11.5–15.5)
RDW: 20 % — ABNORMAL HIGH (ref 11.5–15.5)
WBC: 9.5 10*3/uL (ref 4.0–10.5)
WBC: 8.4 10*3/uL (ref 4.0–10.5)
nRBC: 0 % (ref 0.0–0.2)
nRBC: 0.2 % (ref 0.0–0.2)

## 2018-11-27 LAB — FERRITIN: Ferritin: 95 ng/mL (ref 11–307)

## 2018-11-27 LAB — PHOSPHORUS: Phosphorus: 5.7 mg/dL — ABNORMAL HIGH (ref 2.5–4.6)

## 2018-11-27 LAB — GLUCOSE, CAPILLARY
Glucose-Capillary: 104 mg/dL — ABNORMAL HIGH (ref 70–99)
Glucose-Capillary: 105 mg/dL — ABNORMAL HIGH (ref 70–99)
Glucose-Capillary: 82 mg/dL (ref 70–99)
Glucose-Capillary: 89 mg/dL (ref 70–99)
Glucose-Capillary: 90 mg/dL (ref 70–99)
Glucose-Capillary: 93 mg/dL (ref 70–99)
Glucose-Capillary: 99 mg/dL (ref 70–99)

## 2018-11-27 LAB — PROTIME-INR
INR: 1.3 — ABNORMAL HIGH (ref 0.8–1.2)
Prothrombin Time: 16.4 seconds — ABNORMAL HIGH (ref 11.4–15.2)

## 2018-11-27 LAB — IRON AND TIBC
Iron: 24 ug/dL — ABNORMAL LOW (ref 28–170)
Saturation Ratios: 8 % — ABNORMAL LOW (ref 10.4–31.8)
TIBC: 314 ug/dL (ref 250–450)
UIBC: 290 ug/dL

## 2018-11-27 LAB — CULTURE, BLOOD (ROUTINE X 2)
Culture: NO GROWTH
Culture: NO GROWTH
Special Requests: ADEQUATE
Special Requests: ADEQUATE

## 2018-11-27 LAB — MAGNESIUM: Magnesium: 2 mg/dL (ref 1.7–2.4)

## 2018-11-27 LAB — CULTURE, RESPIRATORY W GRAM STAIN

## 2018-11-27 LAB — OCCULT BLOOD X 1 CARD TO LAB, STOOL: Fecal Occult Bld: POSITIVE — AB

## 2018-11-27 MED ORDER — BOOST / RESOURCE BREEZE PO LIQD CUSTOM
1.0000 | Freq: Three times a day (TID) | ORAL | Status: DC
  Administered 2018-11-27 – 2018-11-28 (×3): 1 via ORAL

## 2018-11-27 MED ORDER — FUROSEMIDE 10 MG/ML IJ SOLN
40.0000 mg | Freq: Two times a day (BID) | INTRAMUSCULAR | Status: DC
  Administered 2018-11-27 (×2): 40 mg via INTRAVENOUS
  Filled 2018-11-27 (×2): qty 4

## 2018-11-27 NOTE — NC FL2 (Signed)
[place] LEVEL OF CARE SCREENING TOOL     IDENTIFICATION  Patient Name: Marcia Saunders Birthdate: 02-Oct-1930 Sex: female Admission Date (Current Location): 11/22/2018  [facility] and [place] Number:  Herbalist and Address:  [facility], [place] [address] [place], [place] [address]      Provider Number: [id number]  Attending Physician Name and Address:  Kayleen Memos, DO  Relative Name and Phone Number:       Current Level of Care: Hospital Recommended Level of Care: [place] Prior Approval Number:    Date Approved/Denied:   PASRR Number: [id number]  Discharge Plan: SNF    Current Diagnoses: Patient Active Problem List   Diagnosis Date Noted  . Acute respiratory failure with hypoxia ([place])   . Hemorrhagic shock ([place]) 11/22/2018  . Anemia   . Gastrointestinal hemorrhage   . Cardiac arrest ([place])   . Coagulopathy ([place])     Orientation RESPIRATION BLADDER Height & Weight     Self  O2(Nasal Cannula 5L) Continent Weight: 241 lb 3.2 oz (109.4 kg) Height:  5\' 6"  (167.6 cm)  BEHAVIORAL SYMPTOMS/MOOD NEUROLOGICAL BOWEL NUTRITION STATUS      Incontinent Diet(DYS 2 diet)  AMBULATORY STATUS COMMUNICATION OF NEEDS Skin   Extensive Assist Verbally Normal                       Personal Care Assistance Level of Assistance  Bathing, Dressing, Feeding Bathing Assistance: Maximum assistance Feeding assistance: Limited assistance Dressing Assistance: Maximum assistance     Functional Limitations Info  Sight, Speech, Hearing Sight Info: Adequate Hearing Info: Adequate Speech Info: Adequate    SPECIAL CARE FACTORS FREQUENCY  PT (By licensed PT), OT (By licensed OT)     PT Frequency: 5x OT Frequency: 5x            Contractures Contractures Info: Not present    Additional Factors Info  Code Status, Allergies Code Status Info: Full Code Allergies Info: NO known allergies           Current  Medications (11/27/2018):  This is the current hospital active medication list Current Facility-Administered Medications  Medication Dose Route Frequency Provider Last Rate Last Dose  . 0.9 %  sodium chloride infusion   Intra-arterial PRN Blanchie Dessert, MD      . acetaminophen (TYLENOL) tablet 650 mg  650 mg Oral Q6H PRN Noemi Chapel P, DO   650 mg at 11/26/18 1644  . Chlorhexidine Gluconate Cloth 2 % PADS 6 each  6 each Topical Daily Margaretha Seeds, MD   6 each at 11/26/18 1050  . feeding supplement (VITAL HIGH PROTEIN) liquid 1,000 mL  1,000 mL Per Tube Q24H Margaretha Seeds, MD      . fentaNYL (SUBLIMAZE) injection 12.5 mcg  12.5 mcg Intravenous Q2H PRN Noemi Chapel P, DO   12.5 mcg at 11/26/18 2222  . furosemide (LASIX) injection 40 mg  40 mg Intravenous Q12H Lelon Perla, MD   40 mg at 11/27/18 0828  . lidocaine (LIDODERM) 5 % 1 patch  1 patch Transdermal Q24H Noemi Chapel P, DO   1 patch at 11/26/18 1646  . MEDLINE mouth rinse  15 mL Mouth Rinse BID Margaretha Seeds, MD   15 mL at 11/26/18 2237  . metoprolol tartrate (LOPRESSOR) injection 2.5 mg  2.5 mg Intravenous Q5 min PRN Noemi Chapel P, DO   2.5 mg at 11/25/18 1706  .  pantoprazole (PROTONIX) injection 40 mg  40 mg Intravenous Q12H Charlott Rakes, MD   40 mg at 11/27/18 9024  . phenol (CHLORASEPTIC) mouth spray 1 spray  1 spray Mouth/Throat PRN [place], Rosalie Gums, MD         Discharge Medications: Please see discharge summary for a list of discharge medications.  Relevant Imaging Results:  Relevant Lab Results:   Additional Information SSN: [id number]  Maree Krabbe, LCSW

## 2018-11-27 NOTE — Progress Notes (Signed)
SLP Cancellation Note  Patient Details Name: Marcia Saunders MRN: [id number] DOB: 1930-04-25   Cancelled treatment:       Reason Eval/Treat Not Completed: Fatigue/lethargy limiting ability to participate. Attempted to see pt for PO trials but she was sleeping soundly. She would open her eyes and stir to stimulation but fall right back asleep. Will f/u as able.   Venita Sheffield Ronique Simerly 11/27/2018, 10:14 AM  Pollyann Glen, M.A. [place] Acute Environmental education officer [phone] Office [phone]

## 2018-11-27 NOTE — Progress Notes (Signed)
PROGRESS NOTE  BARRETT HOLTHAUS GYF:[id number] DOB: 12/20/1930 DOA: 11/22/2018 PCP: Patient, No Pcp Per  HPI/Recap of past 24 hours: Marcia Saunders is a 85 y.o. female with unknown PMH who was admitted 11/22/18 after cardiac arrest presumed due to hemorrhagic shock from GI bleed of unclear etiology.  Significant Hospital Events   9/30 > admit. Got 4 units of PRBC; 2 FFP. Post arrest 10/1 seen by cards and GI 10/2 off pressors. Getting lasix. Hgb stable   Consults:  GI. Cardiology.  Procedures:  ETT 9/30 > 10/2 CVL pending 9/30 >  Arterial line 9/30 >  Significant Diagnostic Tests:  CXR 9/30 > atelectasis. CT abdomen and pelvis 10/1: Anasarca,Area of low-attenuation along the anterior liver margin, unclear what this is bibasilar airspace disease likely representing dependent atelectasis with effusions Echo 10/1 >  Ejection fraction 60 to 65% moderate increased; the left atrium was moderately dilated the right atrium was severely dilated there was severe tricuspid valve regurgitation there was mildly elevated pulmonary artery systolic pressures, suggested right atrial pressure 3 mmHg  Micro Data:  SARS CoV2 9/30 > negative  Antimicrobials:  Ceftriaxone 9/30 x 1.   Interim history/subjective:  Self Extubated on 11/24/18 and tolerated it well.  Transferred to [facility] service on 11/27/2018.  11/27/18: Patient was seen and examined at bedside.  He denies any chest pain or shortness of breath on nasal cannula while laying in bed.  No oxygen supplementation at baseline.  Currently on 5 L and saturating 95%.  Blood pressures have been soft and are closely monitored.    Assessment/Plan: Active Problems:   Hemorrhagic shock (HCC)   Acute respiratory failure with hypoxia (HCC)   Status post cardiac arrest in the setting of suspected hemorrhagic shock from presumed lower GI bleed with hematochezia Presented with hemoglobin of 4 Transfused 4 units PRBCs and 2 unit FFP's on  11/23/2018 Hemoglobin 7.6 on 11/27/2018 Continue to monitor H&H Seen by GI no plan for endoscopy at this time  Acute hypoxic respiratory failure likely secondary to pulmonary edema and bilateral pleural effusion Independently reviewed chest x-ray and CT chest done during this admission which showed mild pulmonary edema with small bilateral pleural effusions. Currently on 5 L with O2 saturation of 95% Maintain O2 saturation greater than 92% Home O2 evaluation when close to discharge Continue flutter valve and incentive spirometer Ambulate as tolerated  Acute blood loss anemia Management as stated above Continue to monitor H&H Obtain iron studies   Coagulopathy of unclear etiology Presented with INR 2.3 Received 2 unit FFP's  Severe physical debility/ambulatory dysfunction Assessed by PT with recommendation for SNF CSW consulted for placement  AKI likely prerenal Presented with creatinine of 1.8 Creatinine continues to worsen Creatinine 3.18 on 11/27/2018 Avoid nephrotoxins Closely monitor urine output and electrolytes  Anasarca with right heart failure Ongoing diuresing with IV Lasix 40 mg twice daily Closely monitor urine output, blood pressure, renal function, and electrolytes     Code Status: Full code  Family Communication: None at bedside.  Patient declined calling her family.  Will ask permission again tomorrow.  Disposition Plan: Likely discharge to SNF when hemodynamically stable and cardiology has signed off.   Consultants:  Cardiology  GI    DVT prophylaxis: SCDs   Objective: Vitals:   11/26/18 0813 11/26/18 0908 11/26/18 2007 11/27/18 0355  BP: 130/86 140/90 116/63 (!) 95/54  Pulse: 86 75 69 64  Resp: (!) 25 18 17 17   Temp: (!) 96.4 F (35.8 C) 97.7  F (36.5 C) 98.1 F (36.7 C) (!) 97.4 F (36.3 C)  TempSrc: Axillary Oral Oral Oral  SpO2:  95% 92% 95%  Weight:    109.4 kg  Height:        Intake/Output Summary (Last 24 hours) at  11/27/2018 1338 Last data filed at 11/27/2018 1259 Gross per 24 hour  Intake 320 ml  Output 450 ml  Net -130 ml   Filed Weights   11/25/18 0500 11/26/18 0500 11/27/18 0355  Weight: 109.6 kg 109.9 kg 109.4 kg    Exam:  . General: 85 y.o. year-old female well developed well nourished in no acute distress.  Alert and oriented x3. . Cardiovascular: Regular rate and rhythm with no rubs or gallops.  No thyromegaly or JVD noted.   Marland Kitchen Respiratory: Mild rales at bases.  Poor inspiratory effort. . Abdomen: Soft nontender nondistended with normal bowel sounds x4 quadrants. . Musculoskeletal: Dependent edema all throughout.   Marland Kitchen Psychiatry: Mood is appropriate for condition and setting   Data Reviewed: CBC: Recent Labs  Lab 11/22/18 1640  11/24/18 0447 11/24/18 1148 11/24/18 2000 11/25/18 0447 11/26/18 0501 11/27/18 0546  WBC 14.9*   < > 25.4* 22.9* 23.7*  --  14.3* 9.5  NEUTROABS 11.2*  --   --   --   --   --   --   --   HGB 4.0*   < > 7.9* 7.4* 7.8* 7.8* 7.7* 7.8*  HCT 12.8*   < > 22.7* 21.6* 23.5* 23.0* 23.5* 24.5*  MCV 80.0   < > 81.1 82.1 82.5  --  86.1 86.0  PLT 144*   < > 106* 106* 125*  --  123* 131*   < > = values in this interval not displayed.   Basic Metabolic Panel: Recent Labs  Lab 11/24/18 1001 11/24/18 1518 11/25/18 0307 11/25/18 0447 11/25/18 1823 11/26/18 0501 11/27/18 0546  NA 134* 133* 134* 133* 132* 135 136  K 4.0 3.9 3.8 3.8 3.6 3.9 4.7  CL 100 99 100  --  97* 97* 99  CO2 24 24 25   --  24 25 25   GLUCOSE 83 81 75  --  107* 105* 107*  BUN 49* 51* 54*  --  57* 63* 68*  CREATININE 1.83* 1.86* 2.48*  --  2.61* 2.92* 3.18*  CALCIUM 7.6* 7.5* 7.8*  --  7.7* 7.9* 8.1*  MG 1.7 1.8 1.8  --  1.9  --  2.0  PHOS 4.5 4.5 4.9*  --  5.0*  --  5.7*   GFR: Estimated Creatinine Clearance: 15.9 mL/min (A) (by C-G formula based on SCr of 3.18 mg/dL (H)). Liver Function Tests: Recent Labs  Lab 11/22/18 1640  AST 151*  ALT 77*  ALKPHOS 59  BILITOT 0.6  PROT 3.3*   ALBUMIN 1.3*   No results for input(s): LIPASE, AMYLASE in the last 168 hours. No results for input(s): AMMONIA in the last 168 hours. Coagulation Profile: Recent Labs  Lab 11/23/18 0007 11/23/18 0421 11/24/18 1001 11/25/18 0307 11/27/18 0546  INR 1.7* 1.6* 1.5* 1.3* 1.3*   Cardiac Enzymes: No results for input(s): CKTOTAL, CKMB, CKMBINDEX, TROPONINI in the last 168 hours. BNP (last 3 results) No results for input(s): PROBNP in the last 8760 hours. HbA1C: No results for input(s): HGBA1C in the last 72 hours. CBG: Recent Labs  Lab 11/26/18 2010 11/27/18 0012 11/27/18 0359 11/27/18 0731 11/27/18 1128  GLUCAP 102* 89 93 90 82   Lipid Profile: No results for input(s):  CHOL, HDL, LDLCALC, TRIG, CHOLHDL, LDLDIRECT in the last 72 hours. Thyroid Function Tests: No results for input(s): TSH, T4TOTAL, FREET4, T3FREE, THYROIDAB in the last 72 hours. Anemia Panel: No results for input(s): VITAMINB12, FOLATE, FERRITIN, TIBC, IRON, RETICCTPCT in the last 72 hours. Urine analysis: No results found for: COLORURINE, APPEARANCEUR, LABSPEC, PHURINE, GLUCOSEU, HGBUR, BILIRUBINUR, KETONESUR, PROTEINUR, UROBILINOGEN, NITRITE, LEUKOCYTESUR Sepsis Labs: @LABRCNTIP (procalcitonin:4,lacticidven:4)  ) Recent Results (from the past 240 hour(s))  Culture, blood (Routine X 2) w Reflex to ID Panel     Status: None   Collection Time: 11/22/18  4:40 PM   Specimen: BLOOD  Result Value Ref Range Status   Specimen Description BLOOD BLOOD LEFT FOREARM  Final   Special Requests   Final    BOTTLES DRAWN AEROBIC AND ANAEROBIC Blood Culture adequate volume   Culture   Final    NO GROWTH 5 DAYS Performed at [facility], [address] [place], [place] [address]    Report Status 11/27/2018 FINAL  Final  Culture, blood (Routine X 2) w Reflex to ID Panel     Status: None   Collection Time: 11/22/18  4:40 PM   Specimen: BLOOD  Result Value Ref Range Status   Specimen Description BLOOD LEFT  ANTECUBITAL  Final   Special Requests   Final    BOTTLES DRAWN AEROBIC AND ANAEROBIC Blood Culture adequate volume   Culture   Final    NO GROWTH 5 DAYS Performed at [facility], [address], [place], [place] [address]    Report Status 11/27/2018 FINAL  Final  SARS Coronavirus 2 [facility] order, Performed in [facility] hospital lab) Nasopharyngeal Nasopharyngeal Swab     Status: None   Collection Time: 11/22/18  4:52 PM   Specimen: Nasopharyngeal Swab  Result Value Ref Range Status   SARS Coronavirus 2 NEGATIVE NEGATIVE Final    Comment: (NOTE) If result is NEGATIVE SARS-CoV-2 target nucleic acids are NOT DETECTED. The SARS-CoV-2 RNA is generally detectable in upper and lower  respiratory specimens during the acute phase of infection. The lowest  concentration of SARS-CoV-2 viral copies this assay can detect is 250  copies / mL. A negative result does not preclude SARS-CoV-2 infection  and should not be used as the sole basis for treatment or other  patient management decisions.  A negative result may occur with  improper specimen collection / handling, submission of specimen other  than nasopharyngeal swab, presence of viral mutation(s) within the  areas targeted by this assay, and inadequate number of viral copies  (<250 copies / mL). A negative result must be combined with clinical  observations, patient history, and epidemiological information. If result is POSITIVE SARS-CoV-2 target nucleic acids are DETECTED. The SARS-CoV-2 RNA is generally detectable in upper and lower  respiratory specimens dur ing the acute phase of infection.  Positive  results are indicative of active infection with SARS-CoV-2.  Clinical  correlation with patient history and other diagnostic information is  necessary to determine patient infection status.  Positive results do  not rule out bacterial infection or co-infection with other viruses. If result is PRESUMPTIVE POSTIVE SARS-CoV-2  nucleic acids MAY BE PRESENT.   A presumptive positive result was obtained on the submitted specimen  and confirmed on repeat testing.  While 2019 novel coronavirus  (SARS-CoV-2) nucleic acids may be present in the submitted sample  additional confirmatory testing may be necessary for epidemiological  and / or clinical management purposes  to differentiate between  SARS-CoV-2 and other Sarbecovirus currently known to infect humans.  If clinically indicated additional testing with an alternate test  methodology [phone]([id number]) is advised. The SARS-CoV-2 RNA is generally  detectable in upper and lower respiratory sp ecimens during the acute  phase of infection. The expected result is Negative. Fact Sheet for Patients:  BoilerBrush.com.cyhttps://www.fda.gov/media/136312/download Fact Sheet for Healthcare Providers: https://pope.com/https://www.fda.gov/media/136313/download This test is not yet approved or cleared by the [place] FDA and has been authorized for detection and/or diagnosis of SARS-CoV-2 by FDA under an Emergency Use Authorization (EUA).  This EUA will remain in effect (meaning this test can be used) for the duration of the COVID-19 declaration under Section 564(b)(1) of the Act, 21 U.S.C. section 360bbb-3(b)(1), unless the authorization is terminated or revoked sooner. Performed at [facility], [address] [place], [place] [address]   MRSA PCR Screening     Status: None   Collection Time: 11/22/18  9:59 PM   Specimen: Nasal Mucosa; Nasopharyngeal  Result Value Ref Range Status   MRSA by PCR NEGATIVE NEGATIVE Final    Comment:        The GeneXpert MRSA Assay (FDA approved for NASAL specimens only), is one component of a comprehensive MRSA colonization surveillance program. It is not intended to diagnose MRSA infection nor to guide or monitor treatment for MRSA infections. Performed at [facility], [address] [place], [place] [address]   Culture, respiratory (non-expectorated)      Status: None   Collection Time: 11/24/18  8:27 AM   Specimen: Tracheal Aspirate; Respiratory  Result Value Ref Range Status   Specimen Description TRACHEAL ASPIRATE  Final   Special Requests NONE  Final   Gram Stain   Final    RARE WBC PRESENT, PREDOMINANTLY MONONUCLEAR NO ORGANISMS SEEN Performed at [facility], [address] [place], [place] [address]    Culture RARE CANDIDA ALBICANS  Final   Report Status 11/27/2018 FINAL  Final      Studies: No results found.  Scheduled Meds: . Chlorhexidine Gluconate Cloth  6 each Topical Daily  . feeding supplement (VITAL HIGH PROTEIN)  1,000 mL Per Tube Q24H  . furosemide  40 mg Intravenous Q12H  . lidocaine  1 patch Transdermal Q24H  . mouth rinse  15 mL Mouth Rinse BID  . pantoprazole (PROTONIX) IV  40 mg Intravenous Q12H    Continuous Infusions: . sodium chloride       LOS: 5 days     Darlin Droparole N Hall, MD [facility] Pager [phone]  If 7PM-7AM, please contact night-coverage [email or web address] Password [email or web address] 11/27/2018, 1:38 PM

## 2018-11-27 NOTE — Progress Notes (Addendum)
Progress Note  Patient Name: Marcia Saunders Date of Encounter: 11/27/2018  Primary Cardiologist: Dr Royann Shivers  Subjective   Denies CP or dyspnea  Inpatient Medications    Scheduled Meds: . Chlorhexidine Gluconate Cloth  6 each Topical Daily  . feeding supplement (VITAL HIGH PROTEIN)  1,000 mL Per Tube Q24H  . lidocaine  1 patch Transdermal Q24H  . mouth rinse  15 mL Mouth Rinse BID  . pantoprazole (PROTONIX) IV  40 mg Intravenous Q12H   Continuous Infusions: . sodium chloride    . cefTRIAXone (ROCEPHIN)  IV 2 g (11/26/18 0809)   PRN Meds: Place/Maintain arterial line **AND** sodium chloride, acetaminophen, fentaNYL (SUBLIMAZE) injection, metoprolol tartrate, phenol   Vital Signs    Vitals:   11/26/18 0813 11/26/18 0908 11/26/18 2007 11/27/18 0355  BP: 130/86 140/90 116/63 (!) 95/54  Pulse: 86 75 69 64  Resp: (!) 25 18 17 17   Temp: (!) 96.4 F (35.8 C) 97.7 F (36.5 C) 98.1 F (36.7 C) (!) 97.4 F (36.3 C)  TempSrc: Axillary Oral Oral Oral  SpO2:  95% 92% 95%  Weight:    109.4 kg  Height:        Intake/Output Summary (Last 24 hours) at 11/27/2018 0755 Last data filed at 11/27/2018 0700 Gross per 24 hour  Intake 290 ml  Output 950 ml  Net -660 ml   Last 3 Weights 11/27/2018 11/26/2018 11/25/2018  Weight (lbs) 241 lb 3.2 oz 242 lb 4.6 oz 241 lb 10 oz  Weight (kg) 109.408 kg 109.9 kg 109.6 kg      Telemetry    Sinus with PVCs- Personally Reviewed   Physical Exam   GEN: No acute distress. Obese  Neck: JVP at 10 cm but difficult Cardiac: RRR Respiratory: CTA anteriorly GI: Soft, nontender, non-distended, abdominal wall edema noted MS: 3+ edema. Neuro:  Nonfocal  Psych: Normal affect   Labs    High Sensitivity Troponin:   Recent Labs  Lab 11/23/18 0007 11/23/18 0421 11/23/18 1255 11/23/18 1710 11/23/18 1933  TROPONINIHS 1,721* 2,378* 4,017* 3,844* 3,802*      Chemistry Recent Labs  Lab 11/22/18 1640  11/25/18 1823 11/26/18 0501  11/27/18 0546  NA 132*   < > 132* 135 136  K 4.8   < > 3.6 3.9 4.7  CL 97*   < > 97* 97* 99  CO2 17*   < > 24 25 25   GLUCOSE 93   < > 107* 105* 107*  BUN 27*   < > 57* 63* 68*  CREATININE 1.14*   < > 2.61* 2.92* 3.18*  CALCIUM 7.3*   < > 7.7* 7.9* 8.1*  PROT 3.3*  --   --   --   --   ALBUMIN 1.3*  --   --   --   --   AST 151*  --   --   --   --   ALT 77*  --   --   --   --   ALKPHOS 59  --   --   --   --   BILITOT 0.6  --   --   --   --   GFRNONAA 44*   < > 16* 14* 13*  GFRAA 50*   < > 19* 16* 15*  ANIONGAP 18*   < > 11 13 12    < > = values in this interval not displayed.     Hematology Recent Labs  Lab 11/24/18 2000 11/25/18  11/26/18 0501 11/27/18 0546  WBC 23.7*  --  14.3* 9.5  RBC 2.85*  --  2.73* 2.85*  HGB 7.8* 7.8* 7.7* 7.8*  HCT 23.5* 23.0* 23.5* 24.5*  MCV 82.5  --  86.1 86.0  MCH 27.4  --  28.2 27.4  MCHC 33.2  --  32.8 31.8  RDW 18.7*  --  19.6* 19.7*  PLT 125*  --  123* 131*    BNP Recent Labs  Lab 11/22/18 1640  BNP 739.2*     DDimer  Recent Labs  Lab 11/23/18 0007  DDIMER 5.03*     Patient Profile     85 y.o. 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   Assessment & Plan    1 demand non-ST elevation myocardial infarction-event occurred in the setting of hemoglobin of 4 and cardiac arrest/shock.  Echocardiogram shows no wall motion abnormalities.  No plans for further ischemia evaluation.  Patient not on aspirin given recent GI bleed (initial hemoglobin 4).  Blood pressure borderline.  We will add low-dose beta-blocker later if tolerated.  2 right heart failure/anasarca-patient with diffuse anasarca.  This is felt to be multifactorial including right heart failure (OHS/OSA), acute  renal failure and hypoalbuminemia.  Patient remains markedly volume overloaded.  Would treat with Lasix 40 mg IV twice daily and follow renal function.  3 acute renal insufficiency-creatinine increased to 3.18 today. I/O-660.  Acute renal insufficiency is likely secondary to recent acute events (ATN from anemia-hemoglobin of 4 and post cardiac arrest).  Likely needs nephrology evaluation.  Would continue Lasix at present dose.  4 acute blood loss anemia-hemoglobin 7.8 today.  Transfuse as needed.  Patient has been evaluated by gastroenterology with no plans for endoscopy.  For questions or updates, please contact [place] Please consult [email or web address] for contact info under        Signed, Kirk Ruths, MD  11/27/2018, 7:55 AM

## 2018-11-27 NOTE — Significant Event (Addendum)
Rapid Response Event Note  Overview: Called d/t lethargy and pt intermittently dropping SpO2 to 70s. Per, RN, SpO2 will come back up however very slowly. Time Called: 2132 Arrival Time: 2143 Event Type: Neurologic  Initial Focused Assessment: Pt laying in bed, very lethargic. Will follow commands with repeated stimulation but falls back to sleep immediately.  Pt very weak and edematous. T-97.6, HR-69 (SR), BP-133/86, RR-17, SpO2-100% on [place] 5L, CBG-95. Pt wouldn't speak on my arrival, however during my assessment, began moaning, and then finally said where she was. Pupils 4 and brisk, lungs diminished t/o. Pt with +3 edema t/o.  Interventions: CPAP qhs Neuro checks q4h. Plan of Care (if not transferred): Continue to monitor pt. Call RRT if further assistance needed. Event Summary: Name of Physician Notified: Kennon Holter, NP at 2145    at          [place], Carren Rang

## 2018-11-27 NOTE — Progress Notes (Signed)
Hand-off repost done with Rey in patient's room and patient was very lethargic at the time, daughter said that she has been like this since 4pm and she was unable to get her to eat anything because she couldn't get her to stay awake. Patient opened eyes but with much stimulation and she moan when turning her, will get a BS and continue to monitor.

## 2018-11-27 NOTE — Progress Notes (Signed)
Called to the bedside by RN with concerns for desaturation and unresponsiveness. On assessment, pt responds to verbal commands but is lethargic and falls back to sleep easily. She follows commands. VSS. She denies pain but continues to moan incoherently. She responds appropriately to yes or no questions. Physical exam is currently at baseline.   Episode of unresponsivess - Pt to start on CPAP for ?sleep apnea - Neuro checks q4hrs -Continue to closely monitor.  -CBC and BMP in am.   Lovey Newcomer, NP [facility] 7p-7a [phone]

## 2018-11-27 NOTE — Progress Notes (Signed)
While assessing patient, her sats started to drop and went as low as the 70's on 5L O2 via N/C, increased to 6 L and hurried to get suction, cleaned out mouth and suctioned her. Patient's VSS but her HR did drop to low to mid 50's, charge nurse called and a neuro assessment done with her, wasn't sure if she was exhibiting facial droop and signs of a stroke, was able to finally stimulate her and get her to open her eyes and follow some simple commands, was able to do mouth care and patient took a couple of sips of Breeze for me after sitting her in an upright position, will continue to monitor.

## 2018-11-27 NOTE — Progress Notes (Signed)
Central tele called to let me know that her sats had dropped to 80, and upon arrival to room, found patient to be doing the same thing with not responding unless to much stimulation, again another BS done 99, Rapid response called and will notify [facility] and continue to monitor. Sats came up with much stimulation to awaken her but now her eyes are wide open and beginning to roll th back of her head, charge RN in room with me awaiting Rapid response to come, cleaned out and suctioned patient's mouth to help stimulate her, she will squeeze both hands but very weakly (she is very edemadous in her extremities). Patient will wiggle toes to commands and nods appropriately to questions but still not acting the way she was the previous night, will get some labs and continue to monitor. Patient was given 40 MG IV Lasix earlier in the shift with very little response in urine outpt and NP made aware when she came to see patient, will do frequent Neuro checks, draw a CBC and have respiratory therapy place patient on a CPAP mask. Daughter Jessika Rothery) called and updated on mom's condition and that we had her checked out by medical staff and were going to monitor her more closely.

## 2018-11-27 NOTE — Progress Notes (Signed)
BS 105 but patient still is very sleepy at this time, VSS, will continue to monitor.

## 2018-11-27 NOTE — Progress Notes (Signed)
  Speech Language Pathology Treatment: Dysphagia  Patient Details Name: Marcia Saunders MRN: [id number] DOB: 1930/03/15 Today's Date: 11/27/2018 Time: 4481-8563 SLP Time Calculation (min) (ACUTE ONLY): 13 min  Assessment / Plan / Recommendation Clinical Impression  Pt consumed thin liquids by straw with no overt signs of aspiration even when allowed to drink freely at her own pacing. NT reported a little coughing during lunch that did not seem to coincide with liquid consumption. Pt had prolonged oral preparation and diffuse residue with small bites of soft solids offered, requiring Min cues. She does not appear to be ready for any diet advancement yet, but would continue her Dys 2 diet and thin liquids with assistance during meals to increase safety.    HPI HPI: 85 y.o. female who was admitted 9/30 after cardiac arrest presumed due to hemorrhagic shock from GI bleed      SLP Plan  Continue with current plan of care       Recommendations  Diet recommendations: Dysphagia 2 (fine chop);Thin liquid Liquids provided via: Straw Medication Administration: Whole meds with puree Supervision: Staff to assist with self feeding Compensations: Minimize environmental distractions;Slow rate;Small sips/bites Postural Changes and/or Swallow Maneuvers: Seated upright 90 degrees                Oral Care Recommendations: Oral care BID Follow up Recommendations: Skilled Nursing facility SLP Visit Diagnosis: Dysphagia, unspecified (R13.10) Plan: Continue with current plan of care       GO                Venita Sheffield Tashera Montalvo 11/27/2018, 3:08 PM  Pollyann Glen, M.A. [place] Acute Environmental education officer [phone] Office [phone]

## 2018-11-27 NOTE — Progress Notes (Signed)
Nutrition Follow-up  DOCUMENTATION CODES:   Obesity unspecified  INTERVENTION:   -Boost Breeze po TID, each supplement provides 250 kcal and 9 grams of protein  NUTRITION DIAGNOSIS:   Inadequate oral intake related to inability to eat as evidenced by NPO status.  Now on dysphagia 2 diet.   GOAL:   Patient will meet greater than or equal to 90% of their needs  Progressing.  MONITOR:   PO intake, Supplement acceptance, Labs, Weight trends, I & O's  ASSESSMENT:   Patient with unknown PMH. Presents this admission with cardiac arrest due to presumed hemorrhagic shock from GI bleed.   9/30 Admit, Intubated 10/1 CT abdomen unrevealing, anasarca present 10/2 Off pressors, extubated 10/3 diet advanced to dysphagia 2  **RD working remotely**  Patient now on dysphagia 2 diet, consuming 0-10% of meals today. Pt has been sleepy/lethargic today as well. Will order Boost Breeze supplements to provide additional kcal and protein.  Admission weight: 250 lbs. Current weight: 241 lbs. Per nursing documentation, pt with severe edema in LE and UEs. I/Os: -1.1L since admit UOP 10/4: 950 ml   Medications: IV Lasix Labs reviewed: CBGs: 82-90 Elevated Phos  Mg WNL GFR: 15  Diet Order:   Diet Order            DIET DYS 2 Room service appropriate? Yes with Assist; Fluid consistency: Thin  Diet effective now              EDUCATION NEEDS:   Not appropriate for education at this time  Skin:  Skin Assessment: Skin Integrity Issues: Skin Integrity Issues:: Other (Comment) Other: MASD- groin  Last BM:  10/5  Height:   Ht Readings from Last 1 Encounters:  11/22/18 5\' 6"  (1.676 m)    Weight:   Wt Readings from Last 1 Encounters:  11/27/18 109.4 kg    Ideal Body Weight:  59.1 kg  BMI:  Body mass index is 38.93 kg/m.  Estimated Nutritional Needs:   Kcal:  1600-1800  Protein:  70-80g  Fluid:  1.8L/day  Clayton Bibles, MS, RD, LDN Inpatient Clinical  Dietitian Pager: [phone] After Hours Pager: [phone]

## 2018-11-27 NOTE — Progress Notes (Signed)
Physical Therapy Treatment Patient Details Name: Marcia Saunders MRN: [id number] DOB: 1930-06-27 Today's Date: 11/27/2018    History of Present Illness 85 y.o. female who was admitted 9/30 after cardiac arrest presumed due to hemorrhagic shock from GI bleed. PMH: unknown    PT Comments    Pt agreeable to sitting up EOB.  Pt sat for 14 minutes with min assist - propped on her UEs.  Pt encouraged to do deep breathing and flutter valve. Pts HR stayed between 72-88bpm.  Pt with full body edema and weeping and bed stays wet - her biggest complaint is being wet and cold all the time.  PT will continue to work with her as able.   Follow Up Recommendations  SNF;Supervision/Assistance - 24 hour     Equipment Recommendations  None recommended by PT    Recommendations for Other Services       Precautions / Restrictions Precautions Precautions: Fall Restrictions Weight Bearing Restrictions: No Other Position/Activity Restrictions: pt with full body edema - today arms weeping    Mobility  Bed Mobility Overal bed mobility: Needs Assistance             General bed mobility comments: Pt agreed to try to sit EOB.  I used chuck and assisted her to sitting - with max assist.  pt sat EOB for 14 minutes propped up on her arms.  pt did her flutter valve 2 sets of 10 with me holding it.  pt needed min assist to stay sitting.  pts HR 72-88bpm  Transfers                 General transfer comment: unable at this time  Ambulation/Gait             General Gait Details: unable at this time   Stairs             Wheelchair Mobility    Modified Rankin (Stroke Patients Only)       Balance Overall balance assessment: Needs assistance   Sitting balance-Leahy Scale: Fair Sitting balance - Comments: pt propped up with her UEs - esp left UE holding her up                                    Cognition Arousal/Alertness: Lethargic Behavior During Therapy:  Flat affect Overall Cognitive Status: Impaired/Different from baseline                                 General Comments: pt complaining of being wet and cold - we changed her gown and linens but with her weeping -s he will be wet again soon.  pt cooperative - felt good to move      Exercises Other Exercises Other Exercises: Pt supine did AAROM shoulder flexion x 10, bicep curls x 10 Other Exercises: pt supine - did PROM heel slide movement x 10 reps each leg Other Exercises: Pt sitting EOB - did flutter valve x 2 sets of 10 reps with PT holding it for her - encouraging deep breathing    General Comments General comments (skin integrity, edema, etc.): weeping in all 4 extremities - noted with profuse swelling.  encouraged deep breathing in sitting      Pertinent Vitals/Pain Pain Assessment: No/denies pain Faces Pain Scale: Hurts even more Pain Location: generalized Pain Descriptors / Indicators: Aching;Grimacing;Guarding;Discomfort  Pain Intervention(s): Monitored during session;Repositioned    Home Living                      Prior Function            PT Goals (current goals can now be found in the care plan section) Progress towards PT goals: Not progressing toward goals - comment(pt cooperative but limited by her full body edema)    Frequency    Min 2X/week      PT Plan Current plan remains appropriate    Co-evaluation              AM-PAC PT "6 Clicks" Mobility   Outcome Measure  Help needed turning from your back to your side while in a flat bed without using bedrails?: Total Help needed moving from lying on your back to sitting on the side of a flat bed without using bedrails?: Total Help needed moving to and from a bed to a chair (including a wheelchair)?: Total Help needed standing up from a chair using your arms (e.g., wheelchair or bedside chair)?: Total Help needed to walk in hospital room?: Total Help needed climbing 3-5 steps  with a railing? : Total 6 Click Score: 6    End of Session Equipment Utilized During Treatment: Oxygen Activity Tolerance: Patient limited by fatigue;Patient limited by pain Patient left: in bed;with call bell/phone within reach;with bed alarm set Nurse Communication: Mobility status;Need for lift equipment PT Visit Diagnosis: Muscle weakness (generalized) (M62.81);Pain     Time: 1455-1520 PT Time Calculation (min) (ACUTE ONLY): 25 min  Charges:  $Therapeutic Activity: 23-37 mins                     11/27/2018   Ranae Palms, PT    Judson Roch 11/27/2018, 3:42 PM

## 2018-11-27 NOTE — TOC Initial Note (Signed)
Transition of Care [facility]) - Initial/Assessment Note    Patient Details  Name: Marcia Saunders MRN: [id number] Date of Birth: 1930-07-30  [facility]) CM/SW Contact:    Eileen Stanford, LCSW Phone Number: 11/27/2018, 11:23 AM  Clinical Narrative:   Pt is only alert to self. CSW spoke with pt's daughter via telephone. Pt's daughter is agreeable to SNF and would like for pt to be in a SNF in [place]. CSW has faxed referral to [facility]. CSW has emailed pt's daughter SNF list with her permission in the meantime for her to look them over. CSW will updated pt's daughter with bed offers once available.               Expected Discharge Plan: Skilled Nursing Facility Barriers to Discharge: Continued Medical Work up   Patient Goals and CMS Choice Patient states their goals for this hospitalization and ongoing recovery are:: "i guess rehab"- per daughter CMS Medicare.gov Compare Post Acute Care list provided to:: Patient Represenative (must comment)(via email) Choice offered to / list presented to : Adult Children  Expected Discharge Plan and Services Expected Discharge Plan: [place] In-house Referral: NA   Post Acute Care Choice: [place] Living arrangements for the past 2 months: Single Family Home                                      Prior Living Arrangements/Services Living arrangements for the past 2 months: Single Family Home Lives with:: Adult Children Patient language and need for interpreter reviewed:: Yes Do you feel safe going back to the place where you live?: Yes      Need for Family Participation in Patient Care: Yes (Comment) Care giver support system in place?: Yes (comment)   Criminal Activity/Legal Involvement Pertinent to Current Situation/Hospitalization: No - Comment as needed  Activities of Daily Living      Permission Sought/Granted Permission sought to share information with : Family  Supports    Share Information with NAME: Loan  Permission granted to share info w AGENCY: [facility] SNFs  Permission granted to share info w Relationship: daughter  Permission granted to share info w Contact Information: [phone]  Emotional Assessment Appearance:: Appears stated age Attitude/Demeanor/Rapport: Unable to Assess Affect (typically observed): Unable to Assess Orientation: : Oriented to Self Alcohol / Substance Use: Not Applicable Psych Involvement: No (comment)  Admission diagnosis:  Hemorrhagic shock ([place]) [R57.8] Encounter for central line placement [Z45.2] Gastrointestinal hemorrhage, unspecified gastrointestinal hemorrhage type [K92.2] Anemia, unspecified type [D64.9] Patient Active Problem List   Diagnosis Date Noted  . Acute respiratory failure with hypoxia ([place])   . Hemorrhagic shock ([place]) 11/22/2018  . Anemia   . Gastrointestinal hemorrhage   . Cardiac arrest ([place])   . Coagulopathy ([place])    PCP:  Patient, No Pcp Per Pharmacy:   [id number] - [place], [place]. [place]  [address] Phone: [phone] Fax: [phone]     Social Determinants of Health (SDOH) Interventions    Readmission Risk Interventions No flowsheet data found.

## 2018-11-27 NOTE — Care Management Important Message (Signed)
Important Message  Patient Details  Name: Marcia Saunders MRN: [id number] Date of Birth: 07-28-1930   Medicare Important Message Given:  Yes     Shelda Altes 11/27/2018, 2:15 PM

## 2018-11-28 ENCOUNTER — Inpatient Hospital Stay (HOSPITAL_COMMUNITY): Payer: Medicare Other

## 2018-11-28 DIAGNOSIS — I50811 Acute right heart failure: Secondary | ICD-10-CM

## 2018-11-28 LAB — BLOOD GAS, ARTERIAL
Acid-Base Excess: 0.4 mmol/L (ref 0.0–2.0)
Bicarbonate: 25.7 mmol/L (ref 20.0–28.0)
Delivery systems: POSITIVE
Drawn by: 40415
O2 Content: 4 L/min
O2 Saturation: 93.8 %
Patient temperature: 98.6
pCO2 arterial: 51 mmHg — ABNORMAL HIGH (ref 32.0–48.0)
pH, Arterial: 7.323 — ABNORMAL LOW (ref 7.350–7.450)
pO2, Arterial: 72.7 mmHg — ABNORMAL LOW (ref 83.0–108.0)

## 2018-11-28 LAB — BASIC METABOLIC PANEL
Anion gap: 12 (ref 5–15)
BUN: 75 mg/dL — ABNORMAL HIGH (ref 8–23)
CO2: 25 mmol/L (ref 22–32)
Calcium: 8.3 mg/dL — ABNORMAL LOW (ref 8.9–10.3)
Chloride: 101 mmol/L (ref 98–111)
Creatinine, Ser: 3.49 mg/dL — ABNORMAL HIGH (ref 0.44–1.00)
GFR calc Af Amer: 13 mL/min — ABNORMAL LOW (ref >60)
GFR calc non Af Amer: 11 mL/min — ABNORMAL LOW (ref >60)
Glucose, Bld: 104 mg/dL — ABNORMAL HIGH (ref 70–99)
Potassium: 4.4 mmol/L (ref 3.5–5.1)
Sodium: 138 mmol/L (ref 135–145)

## 2018-11-28 LAB — GLUCOSE, CAPILLARY
Glucose-Capillary: 108 mg/dL — ABNORMAL HIGH (ref 70–99)
Glucose-Capillary: 68 mg/dL — ABNORMAL LOW (ref 70–99)
Glucose-Capillary: 81 mg/dL (ref 70–99)
Glucose-Capillary: 82 mg/dL (ref 70–99)
Glucose-Capillary: 87 mg/dL (ref 70–99)
Glucose-Capillary: 87 mg/dL (ref 70–99)
Glucose-Capillary: 89 mg/dL (ref 70–99)
Glucose-Capillary: 98 mg/dL (ref 70–99)

## 2018-11-28 LAB — CBC
HCT: 24.7 % — ABNORMAL LOW (ref 36.0–46.0)
Hemoglobin: 7.5 g/dL — ABNORMAL LOW (ref 12.0–15.0)
MCH: 26.8 pg (ref 26.0–34.0)
MCHC: 30.4 g/dL (ref 30.0–36.0)
MCV: 88.2 fL (ref 80.0–100.0)
Platelets: 156 10*3/uL (ref 150–400)
RBC: 2.8 MIL/uL — ABNORMAL LOW (ref 3.87–5.11)
RDW: 19.9 % — ABNORMAL HIGH (ref 11.5–15.5)
WBC: 8.5 10*3/uL (ref 4.0–10.5)
nRBC: 0 % (ref 0.0–0.2)

## 2018-11-28 LAB — PREPARE RBC (CROSSMATCH)

## 2018-11-28 MED ORDER — FUROSEMIDE 10 MG/ML IJ SOLN
80.0000 mg | Freq: Two times a day (BID) | INTRAMUSCULAR | Status: DC
  Administered 2018-11-28: 80 mg via INTRAVENOUS
  Filled 2018-11-28: qty 8

## 2018-11-28 MED ORDER — SODIUM CHLORIDE 0.9 % IV SOLN
510.0000 mg | Freq: Once | INTRAVENOUS | Status: AC
  Administered 2018-11-28: 10:00:00 510 mg via INTRAVENOUS
  Filled 2018-11-28: qty 17

## 2018-11-28 MED ORDER — SODIUM CHLORIDE 0.9% IV SOLUTION
Freq: Once | INTRAVENOUS | Status: AC
  Administered 2018-11-29: 09:00:00 via INTRAVENOUS

## 2018-11-28 MED ORDER — IPRATROPIUM BROMIDE 0.02 % IN SOLN
0.5000 mg | Freq: Three times a day (TID) | RESPIRATORY_TRACT | Status: DC
  Administered 2018-11-28 (×2): 0.5 mg via RESPIRATORY_TRACT
  Filled 2018-11-28 (×2): qty 2.5

## 2018-11-28 MED ORDER — ONDANSETRON HCL 4 MG/2ML IJ SOLN
4.0000 mg | Freq: Four times a day (QID) | INTRAMUSCULAR | Status: DC | PRN
  Administered 2018-11-28 – 2018-11-29 (×2): 4 mg via INTRAVENOUS
  Filled 2018-11-28 (×2): qty 2

## 2018-11-28 MED ORDER — DEXTROSE 50 % IV SOLN
INTRAVENOUS | Status: AC
  Administered 2018-11-28: 04:00:00 50 mL
  Filled 2018-11-28: qty 50

## 2018-11-28 MED ORDER — LEVALBUTEROL HCL 0.63 MG/3ML IN NEBU
0.6300 mg | INHALATION_SOLUTION | Freq: Three times a day (TID) | RESPIRATORY_TRACT | Status: DC
  Administered 2018-11-28 (×2): 0.63 mg via RESPIRATORY_TRACT
  Filled 2018-11-28 (×2): qty 3

## 2018-11-28 NOTE — Progress Notes (Signed)
BS 68 amd patient is NPO D/T CPAP, gave 1/2 amp D50 and notified [facility] of VS and BS, Neuro status is much better and patient is alert and talking at this time, will continue to monitor and will get another BS in 15-30 minutes as per protocol.

## 2018-11-28 NOTE — Progress Notes (Signed)
Progress Note  Patient Name: Marcia Saunders Date of Encounter: 11/28/2018  Primary Cardiologist: Dr Sallyanne Kuster  Subjective   Pt without CP or dyspnea  Inpatient Medications    Scheduled Meds: . Chlorhexidine Gluconate Cloth  6 each Topical Daily  . feeding supplement  1 Container Oral TID BM  . furosemide  40 mg Intravenous Q12H  . lidocaine  1 patch Transdermal Q24H  . mouth rinse  15 mL Mouth Rinse BID  . pantoprazole (PROTONIX) IV  40 mg Intravenous Q12H   Continuous Infusions: . sodium chloride    . ferumoxytol     PRN Meds: Place/Maintain arterial line **AND** sodium chloride, acetaminophen, fentaNYL (SUBLIMAZE) injection, metoprolol tartrate, phenol   Vital Signs    Vitals:   11/27/18 2148 11/27/18 2345 11/28/18 0315 11/28/18 0325  BP: 133/86 (!) 143/76 121/72 121/72  Pulse: 69 69  68  Resp: 17 11  14   Temp:   (!) 97.2 F (36.2 C)   TempSrc:   Axillary   SpO2: 100% 100%  92%  Weight:   108.5 kg   Height:        Intake/Output Summary (Last 24 hours) at 11/28/2018 0818 Last data filed at 11/28/2018 0334 Gross per 24 hour  Intake 140 ml  Output 375 ml  Net -235 ml   Last 3 Weights 11/28/2018 11/27/2018 11/26/2018  Weight (lbs) 239 lb 3.2 oz 241 lb 3.2 oz 242 lb 4.6 oz  Weight (kg) 108.5 kg 109.408 kg 109.9 kg      Telemetry    Sinus with PVCs- Personally Reviewed   Physical Exam   GEN: NAD obes3e Neck: supple Cardiac: RRR, no murmur Respiratory: CTA anteriorly; no wheeze GI: Soft, NT/ND; abdominal wall edema still present MS: 3+ edema. Neuro:  Grossly intact  Labs    High Sensitivity Troponin:   Recent Labs  Lab 11/23/18 0007 11/23/18 0421 11/23/18 1255 11/23/18 1710 11/23/18 1933  TROPONINIHS 1,721* 2,378* 4,017* 3,844* 3,802*      Chemistry Recent Labs  Lab 11/22/18 1640  11/26/18 0501 11/27/18 0546 11/28/18 0630  NA 132*   < > 135 136 138  K 4.8   < > 3.9 4.7 4.4  CL 97*   < > 97* 99 101  CO2 17*   < > 25 25 25   GLUCOSE  93   < > 105* 107* 104*  BUN 27*   < > 63* 68* 75*  CREATININE 1.14*   < > 2.92* 3.18* 3.49*  CALCIUM 7.3*   < > 7.9* 8.1* 8.3*  PROT 3.3*  --   --   --   --   ALBUMIN 1.3*  --   --   --   --   AST 151*  --   --   --   --   ALT 77*  --   --   --   --   ALKPHOS 59  --   --   --   --   BILITOT 0.6  --   --   --   --   GFRNONAA 44*   < > 14* 13* 11*  GFRAA 50*   < > 16* 15* 13*  ANIONGAP 18*   < > 13 12 12    < > = values in this interval not displayed.     Hematology Recent Labs  Lab 11/27/18 0546 11/27/18 2253 11/28/18 0630  WBC 9.5 8.4 8.5  RBC 2.85* 2.78* 2.80*  HGB 7.8* 7.9* 7.5*  HCT 24.5* 24.1* 24.7*  MCV 86.0 86.7 88.2  MCH 27.4 28.4 26.8  MCHC 31.8 32.8 30.4  RDW 19.7* 20.0* 19.9*  PLT 131* 142* 156    BNP Recent Labs  Lab 11/22/18 1640  BNP 739.2*     DDimer  Recent Labs  Lab 11/23/18 0007  DDIMER 5.03*     Patient Profile     85 y.o. 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   Assessment & Plan    1 demand non-ST elevation myocardial infarction-event occurred in the setting of hemoglobin of 4 and cardiac arrest/shock.  Echocardiogram shows no wall motion abnormalities.  No plans for further ischemia evaluation.  Patient not on aspirin given recent GI bleed (initial hemoglobin 4).  Blood pressure borderline.  We will add low-dose beta-blocker later if tolerated.  2 right heart failure/anasarca-patient with diffuse anasarca.  This is felt to be multifactorial including right heart failure (OHS/OSA), acute renal failure and hypoalbuminemia. I/O -235. Patient remains markedly volume overloaded.  Would increase Lasix to 80 mg IV twice daily and follow renal function.  3 acute renal  insufficiency-creatinine increased to 3.49 today. Acute renal insufficiency is likely secondary to recent acute events (ATN from anemia-hemoglobin of 4 at time of admission- and post cardiac arrest).  Would consult nephrology.  4 acute blood loss anemia-hemoglobin 7.8 today.  Transfuse as needed.  Patient has been evaluated by gastroenterology with no plans for endoscopy.  For questions or updates, please contact [facility] Please consult [email or web address] for contact info under        Signed, Olga Millers, MD  11/28/2018, 8:18 AM

## 2018-11-28 NOTE — TOC Progression Note (Signed)
Transition of Care [facility]) - Progression Note    Patient Details  Name: Marcia Saunders MRN: [id number] Date of Birth: 23-Aug-1930  [facility] CM/SW Contact  Eileen Stanford, LCSW Phone Number: 11/28/2018, 3:57 PM  Clinical Narrative:   Pt's [facility]. PT WILL NEED UPDATED COVID-19 TEST PRIOR TO D/C TO SNF.    Expected Discharge Plan: [place] Barriers to Discharge: Continued Medical Work up  Expected Discharge Plan and Services Expected Discharge Plan: [place] In-house Referral: NA   Post Acute Care Choice: [place] Living arrangements for the past 2 months: Single Family Home                                       Social Determinants of Health (SDOH) Interventions    Readmission Risk Interventions No flowsheet data found.

## 2018-11-28 NOTE — Progress Notes (Addendum)
Was called by nursing to inform me 5 mg IV lopressor was given for what appeared to be Afib RVR on telemetry with HR 120-140s.  Pt converted to sinus bradycardia. Confirmed with EKG. Pt was asymptomatic during this episode.  Telemetry reviewed with Dr. Stanford Breed - tachycardia does appear Afib. No change in therapy at this time.

## 2018-11-28 NOTE — Progress Notes (Signed)
BS now is 108, will contiinue to monitor.

## 2018-11-28 NOTE — Progress Notes (Addendum)
PROGRESS NOTE  Marcia JuneauOschia L Corpus WUJ:[id number] DOB: 06/02/1930 DOA: 11/22/2018 PCP: Patient, No Pcp Per  HPI/Recap of past 24 hours: Marcia Saunders is a 85 y.o. female with unknown PMH who was admitted 11/22/18 after cardiac arrest presumed due to hemorrhagic shock from GI bleed of unclear etiology.  Significant Hospital Events   9/30 > admit. Got 4 units of PRBC; 2 FFP. Post arrest 10/1 seen by cards and GI 10/2 off pressors. Getting lasix. Hgb stable   Consults:  GI. Cardiology.  Procedures:  ETT 9/30 > 10/2 CVL pending 9/30 >  Arterial line 9/30 >  Significant Diagnostic Tests:  CXR 9/30 > atelectasis. CT abdomen and pelvis 10/1: Anasarca,Area of low-attenuation along the anterior liver margin, unclear what this is bibasilar airspace disease likely representing dependent atelectasis with effusions Echo 10/1 >  Ejection fraction 60 to 65% moderate increased; the left atrium was moderately dilated the right atrium was severely dilated there was severe tricuspid valve regurgitation there was mildly elevated pulmonary artery systolic pressures, suggested right atrial pressure 3 mmHg  Micro Data:  SARS CoV2 9/30 > negative  Antimicrobials:  Ceftriaxone 9/30 x 1.   Interim history/subjective:  Self Extubated on 11/24/18 and tolerated it well.  [facility]  11/28/18: Patient was seen and examined at her bedside this morning.  Per night shift bedside RN patient became severely hypoxic during her sleep with O2 saturation in the 70s, was minimally responsive which prompted call for rapid response.  This morning she is on CPAP more alert and responding to questions appropriately.   Bedside RN reports what appears to be transient A. fib with RVR.  Lopressor given with conversion to sinus bradycardia.  Cardiology notified.    Assessment/Plan: Active Problems:   Hemorrhagic shock (HCC)   Acute respiratory failure with hypoxia (HCC)    Status post cardiac arrest in the setting of suspected hemorrhagic shock from presumed lower GI bleed with hematochezia Presented with hemoglobin of 4 Transfused 4 units PRBCs and 2 unit FFP's on 11/23/2018 Hemoglobin 7.6 on 11/27/2018 Hemoglobin 7.2 on 11/28/2018 Continue to monitor H&H Seen by GI no plan for endoscopy at this time  Acute blood loss anemia in the setting of severe iron deficiency Hemoglobin low and continues to drop, FOBT positive Hemoglobin 7.2 from 7.6 Transfuse 2 unit PRBC in the setting of symptomatic anemia and concern for GI bleed on 11/28/2018 Repeat H&H after blood transfusion Infused 1 dose of IV Feraheme 510 mg once  Acute hypoxic hypercarbic respiratory failure likely secondary to suspected underlying OSA, pulmonary edema and bilateral pleural effusion Per overnight bedside RN patient became severely hypoxic during sleep with O2 saturation dropping in the 70s Independently reviewed chest x-ray and CT chest done during this admission which showed mild pulmonary edema with small bilateral pleural effusions. Currently on 4 L with saturation of 98% Start Xopenex and Atrovent nebs every 8 hours Continue to maintain O2 saturation greater than 92% Start BiPAP at night Repeat chest x-ray Continue incentive spirometer and flutter valve  Transient A. fib with RVR Rate in the 150s, received IV Lopressor and converted to sinus bradycardia To note, recent GI bleed with hemoglobin of 4 on presentation Cardiology notified Continue to closely monitor on telemetry  Acute diastolic CHF Last 2D echo done on 11/23/2018 showed LVEF 60 to 65% Ongoing diuresing with IV Lasix 80 mg twice daily Continue strict I's and O's and daily weight Start salt restriction less than 2 g a  day and fluid restriction less than 1500/day Net I&O -1.6 L Cardiology following  Suspected underlying untreated OSA Became severely hypoxic with oxygen saturation dropping in the 70s, elevated PCO2 51  on ABG while on CPAP Will need outpatient polysomnography Continue BiPAP at night  Coagulopathy of unclear etiology Presented with INR 2.3 Received 2 unit FFP's INR 1.3 on 11/27/2018  Severe physical debility/ambulatory dysfunction Assessed by PT with recommendation for SNF CSW consulted for placement  AKI likely prerenal Presented with creatinine of 1.8 Creatinine continues to worsen Creatinine 3.18 on 11/27/2018 Avoid nephrotoxins Closely monitor urine output and electrolytes  Anasarca with right heart failure Ongoing diuresing with IV Lasix 80 mg twice daily Continue to closely monitor urine output, blood pressure, renal function, and electrolytes Continue daily BMPs    Code Status: Full code  Family Communication: Updated her daughter via phone on 11/27/2018.  Disposition Plan: Likely discharge to SNF when hemodynamically stable and cardiology has signed off.   Consultants:  Cardiology  GI    DVT prophylaxis: SCDs   Objective: Vitals:   11/28/18 0325 11/28/18 1044 11/28/18 1228 11/28/18 1311  BP: 121/72 124/69    Pulse: 68 67 (!) 38 66  Resp: 14 13 19 17   Temp:  97.6 F (36.4 C)    TempSrc:  Oral    SpO2: 92% 98% 100% 99%  Weight:      Height:        Intake/Output Summary (Last 24 hours) at 11/28/2018 1326 Last data filed at 11/28/2018 1205 Gross per 24 hour  Intake 80 ml  Output 575 ml  Net -495 ml   Filed Weights   11/26/18 0500 11/27/18 0355 11/28/18 0315  Weight: 109.9 kg 109.4 kg 108.5 kg    Exam:  . General: 85 y.o. year-old female well-developed well-nourished no acute distress on CPAP.  Alert and interactive. . Cardiovascular: Regular rate and rhythm no rubs or gallops no JVD or thyromegaly noted. Marland Kitchen Respiratory: Mild rales at bases.  No wheezes noted.  Poor inspiratory effort. . Abdomen: Obese nontender nondistended bowel sounds present.   . Musculoskeletal: Dependent edema all throughout with anasarca. Psychiatry: Mood is  appropriate for condition and setting.  Data Reviewed: CBC: Recent Labs  Lab 11/22/18 1640  11/24/18 2000 11/25/18 0447 11/26/18 0501 11/27/18 0546 11/27/18 2253 11/28/18 0630  WBC 14.9*   < > 23.7*  --  14.3* 9.5 8.4 8.5  NEUTROABS 11.2*  --   --   --   --   --   --   --   HGB 4.0*   < > 7.8* 7.8* 7.7* 7.8* 7.9* 7.5*  HCT 12.8*   < > 23.5* 23.0* 23.5* 24.5* 24.1* 24.7*  MCV 80.0   < > 82.5  --  86.1 86.0 86.7 88.2  PLT 144*   < > 125*  --  123* 131* 142* 156   < > = values in this interval not displayed.   Basic Metabolic Panel: Recent Labs  Lab 11/24/18 1001 11/24/18 1518 11/25/18 0307 11/25/18 0447 11/25/18 1823 11/26/18 0501 11/27/18 0546 11/28/18 0630  NA 134* 133* 134* 133* 132* 135 136 138  K 4.0 3.9 3.8 3.8 3.6 3.9 4.7 4.4  CL 100 99 100  --  97* 97* 99 101  CO2 24 24 25   --  24 25 25 25   GLUCOSE 83 81 75  --  107* 105* 107* 104*  BUN 49* 51* 54*  --  57* 63* 68* 75*  CREATININE 1.83*  1.86* 2.48*  --  2.61* 2.92* 3.18* 3.49*  CALCIUM 7.6* 7.5* 7.8*  --  7.7* 7.9* 8.1* 8.3*  MG 1.7 1.8 1.8  --  1.9  --  2.0  --   PHOS 4.5 4.5 4.9*  --  5.0*  --  5.7*  --    GFR: Estimated Creatinine Clearance: 14.4 mL/min (A) (by C-G formula based on SCr of 3.49 mg/dL (H)). Liver Function Tests: Recent Labs  Lab 11/22/18 1640  AST 151*  ALT 77*  ALKPHOS 59  BILITOT 0.6  PROT 3.3*  ALBUMIN 1.3*   No results for input(s): LIPASE, AMYLASE in the last 168 hours. No results for input(s): AMMONIA in the last 168 hours. Coagulation Profile: Recent Labs  Lab 11/23/18 0007 11/23/18 0421 11/24/18 1001 11/25/18 0307 11/27/18 0546  INR 1.7* 1.6* 1.5* 1.3* 1.3*   Cardiac Enzymes: No results for input(s): CKTOTAL, CKMB, CKMBINDEX, TROPONINI in the last 168 hours. BNP (last 3 results) No results for input(s): PROBNP in the last 8760 hours. HbA1C: No results for input(s): HGBA1C in the last 72 hours. CBG: Recent Labs  Lab 11/28/18 0007 11/28/18 0406 11/28/18 0431  11/28/18 0740 11/28/18 1129  GLUCAP 82 68* 108* 89 87   Lipid Profile: No results for input(s): CHOL, HDL, LDLCALC, TRIG, CHOLHDL, LDLDIRECT in the last 72 hours. Thyroid Function Tests: No results for input(s): TSH, T4TOTAL, FREET4, T3FREE, THYROIDAB in the last 72 hours. Anemia Panel: Recent Labs    11/27/18 1700  FERRITIN 95  TIBC 314  IRON 24*   Urine analysis: No results found for: COLORURINE, APPEARANCEUR, LABSPEC, PHURINE, GLUCOSEU, HGBUR, BILIRUBINUR, KETONESUR, PROTEINUR, UROBILINOGEN, NITRITE, LEUKOCYTESUR Sepsis Labs: @LABRCNTIP (procalcitonin:4,lacticidven:4)  ) Recent Results (from the past 240 hour(s))  Culture, blood (Routine X 2) w Reflex to ID Panel     Status: None   Collection Time: 11/22/18  4:40 PM   Specimen: BLOOD  Result Value Ref Range Status   Specimen Description BLOOD BLOOD LEFT FOREARM  Final   Special Requests   Final    BOTTLES DRAWN AEROBIC AND ANAEROBIC Blood Culture adequate volume   Culture   Final    NO GROWTH 5 DAYS Performed at [facility], [address] [place], [place]    Report Status 11/27/2018 FINAL  Final  Culture, blood (Routine X 2) w Reflex to ID Panel     Status: None   Collection Time: 11/22/18  4:40 PM   Specimen: BLOOD  Result Value Ref Range Status   Specimen Description BLOOD LEFT ANTECUBITAL  Final   Special Requests   Final    BOTTLES DRAWN AEROBIC AND ANAEROBIC Blood Culture adequate volume   Culture   Final    NO GROWTH 5 DAYS Performed at [facility], [address] [place], [place]    Report Status 11/27/2018 FINAL  Final  SARS Coronavirus 2 [facility] order, Performed in [facility] hospital lab) Nasopharyngeal Nasopharyngeal Swab     Status: None   Collection Time: 11/22/18  4:52 PM   Specimen: Nasopharyngeal Swab  Result Value Ref Range Status   SARS Coronavirus 2 NEGATIVE NEGATIVE Final    Comment: (NOTE) If result is NEGATIVE SARS-CoV-2 target nucleic acids are NOT  DETECTED. The SARS-CoV-2 RNA is generally detectable in upper and lower  respiratory specimens during the acute phase of infection. The lowest  concentration of SARS-CoV-2 viral copies this assay can detect is 250  copies / mL. A negative result does not  preclude SARS-CoV-2 infection  and should not be used as the sole basis for treatment or other  patient management decisions.  A negative result may occur with  improper specimen collection / handling, submission of specimen other  than nasopharyngeal swab, presence of viral mutation(s) within the  areas targeted by this assay, and inadequate number of viral copies  (<250 copies / mL). A negative result must be combined with clinical  observations, patient history, and epidemiological information. If result is POSITIVE SARS-CoV-2 target nucleic acids are DETECTED. The SARS-CoV-2 RNA is generally detectable in upper and lower  respiratory specimens dur ing the acute phase of infection.  Positive  results are indicative of active infection with SARS-CoV-2.  Clinical  correlation with patient history and other diagnostic information is  necessary to determine patient infection status.  Positive results do  not rule out bacterial infection or co-infection with other viruses. If result is PRESUMPTIVE POSTIVE SARS-CoV-2 nucleic acids MAY BE PRESENT.   A presumptive positive result was obtained on the submitted specimen  and confirmed on repeat testing.  While 2019 novel coronavirus  (SARS-CoV-2) nucleic acids may be present in the submitted sample  additional confirmatory testing may be necessary for epidemiological  and / or clinical management purposes  to differentiate between  SARS-CoV-2 and other Sarbecovirus currently known to infect humans.  If clinically indicated additional testing with an alternate test  methodology [phone]) is advised. The SARS-CoV-2 RNA is generally  detectable in upper and lower respiratory sp ecimens during  the acute  phase of infection. The expected result is Negative. Fact Sheet for Patients:  StrictlyIdeas.no Fact Sheet for Healthcare Providers: BankingDealers.co.za This test is not yet approved or cleared by the [place] FDA and has been authorized for detection and/or diagnosis of SARS-CoV-2 by FDA under an Emergency Use Authorization (EUA).  This EUA will remain in effect (meaning this test can be used) for the duration of the COVID-19 declaration under Section 564(b)(1) of the Act, 21 U.S.C. section 360bbb-3(b)(1), unless the authorization is terminated or revoked sooner. Performed at [facility], [place] [address], [place], [place] [address]   MRSA PCR Screening     Status: None   Collection Time: 11/22/18  9:59 PM   Specimen: Nasal Mucosa; Nasopharyngeal  Result Value Ref Range Status   MRSA by PCR NEGATIVE NEGATIVE Final    Comment:        The GeneXpert MRSA Assay (FDA approved for NASAL specimens only), is one component of a comprehensive MRSA colonization surveillance program. It is not intended to diagnose MRSA infection nor to guide or monitor treatment for MRSA infections. Performed at [facility], [place] [address], [place], [place] [address]   Culture, respiratory (non-expectorated)     Status: None   Collection Time: 11/24/18  8:27 AM   Specimen: Tracheal Aspirate; Respiratory  Result Value Ref Range Status   Specimen Description TRACHEAL ASPIRATE  Final   Special Requests NONE  Final   Gram Stain   Final    RARE WBC PRESENT, PREDOMINANTLY MONONUCLEAR NO ORGANISMS SEEN Performed at [facility], [address], [place], [place] [address]    Culture RARE CANDIDA ALBICANS  Final   Report Status 11/27/2018 FINAL  Final      Studies: No results found.  Scheduled Meds: . Chlorhexidine Gluconate Cloth  6 each Topical Daily  . feeding supplement  1 Container Oral TID BM  . furosemide  80 mg  Intravenous Q12H  .  lidocaine  1 patch Transdermal Q24H  . mouth rinse  15 mL Mouth Rinse BID  . pantoprazole (PROTONIX) IV  40 mg Intravenous Q12H    Continuous Infusions: . sodium chloride       LOS: 6 days     Darlin Droparole N Lajeana Strough, MD [facility] Pager [phone]  If 7PM-7AM, please contact night-coverage [email or web address] Password [email or web address] 11/28/2018, 1:26 PM

## 2018-11-29 DIAGNOSIS — I50810 Right heart failure, unspecified: Secondary | ICD-10-CM

## 2018-11-29 DIAGNOSIS — G4733 Obstructive sleep apnea (adult) (pediatric): Secondary | ICD-10-CM

## 2018-11-29 DIAGNOSIS — I480 Paroxysmal atrial fibrillation: Secondary | ICD-10-CM

## 2018-11-29 DIAGNOSIS — N179 Acute kidney failure, unspecified: Secondary | ICD-10-CM

## 2018-11-29 DIAGNOSIS — I5031 Acute diastolic (congestive) heart failure: Secondary | ICD-10-CM

## 2018-11-29 DIAGNOSIS — D62 Acute posthemorrhagic anemia: Secondary | ICD-10-CM

## 2018-11-29 DIAGNOSIS — R601 Generalized edema: Secondary | ICD-10-CM

## 2018-11-29 DIAGNOSIS — J9601 Acute respiratory failure with hypoxia: Secondary | ICD-10-CM

## 2018-11-29 DIAGNOSIS — J90 Pleural effusion, not elsewhere classified: Secondary | ICD-10-CM

## 2018-11-29 LAB — BLOOD GAS, ARTERIAL
Acid-Base Excess: 2.2 mmol/L — ABNORMAL HIGH (ref 0.0–2.0)
Bicarbonate: 27.5 mmol/L (ref 20.0–28.0)
Drawn by: 252031
Expiratory PAP: 5
Inspiratory PAP: 10
O2 Content: 5 L/min
O2 Saturation: 94.4 %
Patient temperature: 98.6
pCO2 arterial: 53.2 mmHg — ABNORMAL HIGH (ref 32.0–48.0)
pH, Arterial: 7.334 — ABNORMAL LOW (ref 7.350–7.450)
pO2, Arterial: 75.1 mmHg — ABNORMAL LOW (ref 83.0–108.0)

## 2018-11-29 LAB — GLUCOSE, CAPILLARY
Glucose-Capillary: 94 mg/dL (ref 70–99)
Glucose-Capillary: 98 mg/dL (ref 70–99)
Glucose-Capillary: 121 mg/dL — ABNORMAL HIGH (ref 70–99)
Glucose-Capillary: 112 mg/dL — ABNORMAL HIGH (ref 70–99)
Glucose-Capillary: 118 mg/dL — ABNORMAL HIGH (ref 70–99)
Glucose-Capillary: 128 mg/dL — ABNORMAL HIGH (ref 70–99)

## 2018-11-29 LAB — BPAM RBC
Blood Product Expiration Date: 202011012359
Blood Product Expiration Date: 202011012359
ISSUE DATE / TIME: 202010061656
ISSUE DATE / TIME: 202010062312
Unit Type and Rh: 6200
Unit Type and Rh: 6200

## 2018-11-29 LAB — BASIC METABOLIC PANEL
Anion gap: 14 (ref 5–15)
BUN: 81 mg/dL — ABNORMAL HIGH (ref 8–23)
CO2: 24 mmol/L (ref 22–32)
Calcium: 8.5 mg/dL — ABNORMAL LOW (ref 8.9–10.3)
Chloride: 100 mmol/L (ref 98–111)
Creatinine, Ser: 3.64 mg/dL — ABNORMAL HIGH (ref 0.44–1.00)
GFR calc Af Amer: 12 mL/min — ABNORMAL LOW (ref >60)
GFR calc non Af Amer: 11 mL/min — ABNORMAL LOW (ref >60)
Glucose, Bld: 102 mg/dL — ABNORMAL HIGH (ref 70–99)
Potassium: 4.2 mmol/L (ref 3.5–5.1)
Sodium: 138 mmol/L (ref 135–145)

## 2018-11-29 LAB — TYPE AND SCREEN
ABO/RH(D): A POS
Antibody Screen: NEGATIVE
Unit division: 0
Unit division: 0

## 2018-11-29 LAB — URINALYSIS, ROUTINE W REFLEX MICROSCOPIC
Bilirubin Urine: NEGATIVE
Glucose, UA: NEGATIVE mg/dL
Ketones, ur: NEGATIVE mg/dL
Leukocytes,Ua: NEGATIVE
Nitrite: NEGATIVE
Protein, ur: NEGATIVE mg/dL
Specific Gravity, Urine: 1.009 (ref 1.005–1.030)
pH: 5 (ref 5.0–8.0)

## 2018-11-29 LAB — HEMOGLOBIN AND HEMATOCRIT, BLOOD
HCT: 32.1 % — ABNORMAL LOW (ref 36.0–46.0)
Hemoglobin: 10.6 g/dL — ABNORMAL LOW (ref 12.0–15.0)

## 2018-11-29 LAB — PROTEIN / CREATININE RATIO, URINE
Creatinine, Urine: 30.13 mg/dL
Protein Creatinine Ratio: 1.1 mg/mg{Cre} — ABNORMAL HIGH (ref 0.00–0.15)
Total Protein, Urine: 33 mg/dL

## 2018-11-29 MED ORDER — METOLAZONE 5 MG PO TABS
5.0000 mg | ORAL_TABLET | Freq: Once | ORAL | Status: AC
  Administered 2018-11-29: 5 mg via ORAL
  Filled 2018-11-29: qty 1

## 2018-11-29 MED ORDER — LEVALBUTEROL HCL 0.63 MG/3ML IN NEBU
0.6300 mg | INHALATION_SOLUTION | Freq: Three times a day (TID) | RESPIRATORY_TRACT | Status: DC
  Administered 2018-11-29 – 2018-12-01 (×8): 0.63 mg via RESPIRATORY_TRACT
  Filled 2018-11-29 (×8): qty 3

## 2018-11-29 MED ORDER — METOLAZONE 5 MG PO TABS: 5.0000 mg | ORAL_TABLET | Freq: Every day | ORAL | Status: DC

## 2018-11-29 MED ORDER — FUROSEMIDE 10 MG/ML IJ SOLN: 80.0000 mg | Freq: Three times a day (TID) | INTRAMUSCULAR | Status: DC

## 2018-11-29 MED ORDER — ROSUVASTATIN CALCIUM 5 MG PO TABS
10.0000 mg | ORAL_TABLET | Freq: Every day | ORAL | Status: DC
  Administered 2018-11-29 – 2018-12-01 (×2): 10 mg via ORAL
  Filled 2018-11-29 (×2): qty 2

## 2018-11-29 MED ORDER — IPRATROPIUM BROMIDE 0.02 % IN SOLN
0.5000 mg | Freq: Three times a day (TID) | RESPIRATORY_TRACT | Status: DC
  Administered 2018-11-29 – 2018-12-01 (×8): 0.5 mg via RESPIRATORY_TRACT
  Filled 2018-11-29 (×8): qty 2.5

## 2018-11-29 MED ORDER — FUROSEMIDE 10 MG/ML IJ SOLN
80.0000 mg | Freq: Two times a day (BID) | INTRAMUSCULAR | Status: DC
  Administered 2018-11-29 – 2018-11-30 (×2): 80 mg via INTRAVENOUS
  Filled 2018-11-29 (×2): qty 8

## 2018-11-29 MED ORDER — FUROSEMIDE 10 MG/ML IJ SOLN
120.0000 mg | Freq: Two times a day (BID) | INTRAVENOUS | Status: DC
  Administered 2018-11-29: 120 mg via INTRAVENOUS
  Filled 2018-11-29: qty 10
  Filled 2018-11-29 (×2): qty 12

## 2018-11-29 MED ORDER — METOPROLOL TARTRATE 25 MG PO TABS
25.0000 mg | ORAL_TABLET | Freq: Two times a day (BID) | ORAL | Status: DC
  Administered 2018-11-29: 25 mg via ORAL
  Filled 2018-11-29: qty 1

## 2018-11-29 MED ORDER — METOPROLOL TARTRATE 12.5 MG HALF TABLET
12.5000 mg | ORAL_TABLET | Freq: Two times a day (BID) | ORAL | Status: DC
  Administered 2018-11-30 – 2018-12-01 (×2): 12.5 mg via ORAL
  Filled 2018-11-29 (×3): qty 1

## 2018-11-29 NOTE — Consult Note (Signed)
[facility] Renal Consultation Note    Indication for Consultation:  Management of chronic kidney disease, anemia, hypertension/volume and secondary hyperparathyroidism PCP: No PCP on file.  HPI: Marcia Saunders is a 85 y.o. female with no prior medical history on file prior to admission. Per daughter, patient has been treated in past for hypertension but has not been on medications in years. Also on file is a cardiac cath from 2009 which showed non-obstructive CAD.   Patient presented to ED 11/22/2018 S/P cardiac arrest in setting of acute blood loss from presumed LGIB. HGB was 4.0 on admission. SCr 1.14 BUN EGFR 50 on arrival to ED. Since admission renal function has gradually declined and she has now developed AKI in setting of hypovolemic shock. C with SCr 3.64 EGFR down to 12. She is S/P 2 units of PRBCS, off pressors.  GI and cardiology have been consulted.  Echo done 11/23/2018 revealed EF 60-65% with moderate LVH. R atrial size  RV with mildly reduced systolic function. Severely dilated R Atrial. She has been diuresed on furosemide, however still with generalized anasarca.   Patient lives at home with daughter, is currently on no medications.    Prior to Admission medications   Not on File   Current Facility-Administered Medications  Medication Dose Route Frequency Provider Last Rate Last Dose  . 0.9 %  sodium chloride infusion   Intra-arterial PRN Blanchie Dessert, MD      . acetaminophen (TYLENOL) tablet 650 mg  650 mg Oral Q6H PRN Noemi Chapel P, DO   650 mg at 11/26/18 1644  . Chlorhexidine Gluconate Cloth 2 % PADS 6 each  6 each Topical Daily Margaretha Seeds, MD   6 each at 11/29/18 0445  . feeding supplement (BOOST / RESOURCE BREEZE) liquid 1 Container  1 Container Oral TID BM Kayleen Memos, DO   1 Container at 11/28/18 2120  . fentaNYL (SUBLIMAZE) injection 12.5 mcg  12.5 mcg Intravenous Q2H PRN Julian Hy, DO   12.5 mcg at 11/26/18 2222  . furosemide  (LASIX) 120 mg in dextrose 5 % 50 mL IVPB  120 mg Intravenous Q12H Lelon Perla, MD 62 mL/hr at 11/29/18 0919 120 mg at 11/29/18 0919  . ipratropium (ATROVENT) nebulizer solution 0.5 mg  0.5 mg Nebulization TID Irene Pap N, DO   0.5 mg at 11/29/18 0741  . levalbuterol (XOPENEX) nebulizer solution 0.63 mg  0.63 mg Nebulization TID Irene Pap N, DO   0.63 mg at 11/29/18 0741  . lidocaine (LIDODERM) 5 % 1 patch  1 patch Transdermal Q24H Noemi Chapel P, DO   1 patch at 11/28/18 1844  . MEDLINE mouth rinse  15 mL Mouth Rinse BID Margaretha Seeds, MD   15 mL at 11/28/18 2121  . metoprolol tartrate (LOPRESSOR) injection 2.5 mg  2.5 mg Intravenous Q5 min PRN Noemi Chapel P, DO   2.5 mg at 11/28/18 1222  . metoprolol tartrate (LOPRESSOR) tablet 25 mg  25 mg Oral BID Lelon Perla, MD   25 mg at 11/29/18 0903  . ondansetron (ZOFRAN) injection 4 mg  4 mg Intravenous Q6H PRN Irene Pap N, DO   4 mg at 11/29/18 0418  . pantoprazole (PROTONIX) injection 40 mg  40 mg Intravenous Q12H Wilford Corner, MD   40 mg at 11/29/18 0902  . phenol (CHLORASEPTIC) mouth spray 1 spray  1 spray Mouth/Throat PRN Judd Lien, MD      . rosuvastatin (CRESTOR) tablet 10  mg  10 mg Oral q1800 Lelon Perla, MD       Labs: Basic Metabolic Panel: Recent Labs  Lab 11/25/18 0307  11/25/18 1823  11/27/18 0546 11/28/18 0630 11/29/18 0550  NA 134*   < > 132*   < > 136 138 138  K 3.8   < > 3.6   < > 4.7 4.4 4.2  CL 100  --  97*   < > 99 101 100  CO2 25  --  24   < > '25 25 24  ' GLUCOSE 75  --  107*   < > 107* 104* 102*  BUN 54*  --  57*   < > 68* 75* 81*  CREATININE 2.48*  --  2.61*   < > 3.18* 3.49* 3.64*  CALCIUM 7.8*  --  7.7*   < > 8.1* 8.3* 8.5*  PHOS 4.9*  --  5.0*  --  5.7*  --   --    < > = values in this interval not displayed.   Liver Function Tests: Recent Labs  Lab 11/22/18 1640  AST 151*  ALT 77*  ALKPHOS 59  BILITOT 0.6  PROT 3.3*  ALBUMIN 1.3*   No results for input(s):  LIPASE, AMYLASE in the last 168 hours. No results for input(s): AMMONIA in the last 168 hours. CBC: Recent Labs  Lab 11/22/18 1640  11/24/18 2000  11/26/18 0501 11/27/18 0546 11/27/18 2253 11/28/18 0630 11/29/18 0550  WBC 14.9*   < > 23.7*  --  14.3* 9.5 8.4 8.5  --   NEUTROABS 11.2*  --   --   --   --   --   --   --   --   HGB 4.0*   < > 7.8*   < > 7.7* 7.8* 7.9* 7.5* 10.6*  HCT 12.8*   < > 23.5*   < > 23.5* 24.5* 24.1* 24.7* 32.1*  MCV 80.0   < > 82.5  --  86.1 86.0 86.7 88.2  --   PLT 144*   < > 125*  --  123* 131* 142* 156  --    < > = values in this interval not displayed.   Cardiac Enzymes: No results for input(s): CKTOTAL, CKMB, CKMBINDEX, TROPONINI in the last 168 hours. CBG: Recent Labs  Lab 11/28/18 1954 11/28/18 2353 11/29/18 0422 11/29/18 0806 11/29/18 1130  GLUCAP 87 98 94 98 121*   Iron Studies:  Recent Labs    11/27/18 1700  IRON 24*  TIBC 314  FERRITIN 95   Studies/Results: Dg Chest Port 1 View  Result Date: 11/28/2018 CLINICAL DATA:  Hypoxia. EXAM: PORTABLE CHEST 1 VIEW COMPARISON:  Radiographs 11/24/2018 and 11/23/2018. FINDINGS: 1356 hours. Patient remains rotated to the left. The endotracheal tube and enteric tube have been removed in the interval. The left IJ central venous catheter is unchanged, projecting to the mid SVC level. There is stable cardiomegaly with increasing pulmonary edema and bilateral pleural effusions. No evidence of pneumothorax. The bones appear unchanged. Telemetry leads overlie the chest. IMPRESSION: 1. Worsened pulmonary edema and pleural effusions consistent with congestive heart failure. 2. Stable cardiomegaly. Electronically Signed   By: Richardean Sale M.D.   On: 11/28/2018 15:47    ROS: As per HPI otherwise negative.   Physical Exam: Vitals:   11/29/18 0448 11/29/18 0743 11/29/18 0808 11/29/18 1100  BP:   (!) 150/82 (!) 152/84  Pulse:   71 67  Resp:   17 14  Temp:   97.7 F (36.5 C) 97.6 F (36.4 C)  TempSrc:    Oral Oral  SpO2:  99% 99% 98%  Weight: 109.1 kg     Height:         General: Chronically ill appearing female in NAD Head: Normocephalic, atraumatic, sclera non-icteric, mucus membranes are moist Neck: Supple. JVD elevated 3/4 to mandible. . Lungs: Bilateral breath sounds decreased in bases with few bibasilar crackles.  Heart: RRR with S1 S2. No murmurs, rubs, or gallops appreciated. Abdomen: Soft, non-tender, non-distended with normoactive bowel sounds. No rebound/guarding. No obvious abdominal masses. Lower extremities: 2+ Generalized BLE edema, edema in hips, thighs, lower abdomen.  Neuro: Alert and oriented X 2. Moves all extremities spontaneously.  Assessment/Plan: 1.  AKI on CKD stage 3 in setting of PEA Arrest/hypovolemic shock now complicated by R. Heart failure.  Possibly cardiorenal. Mild hyponatremia. Was CKD stage Currently on Furosemide 120 mg IV q 12 hrs. Change dose to 80 mg IV q 8 hours. Start metolazone 5 mg PO daily and attempt to reduce volume. Follow renal function carefully. Avoid hypotension. Checking renal US, rule out other causes of AKI.  2.  Hypovolemia 2/2 L GIB-per primary. GI consulted but no intervention.  3.  Acute hypoxic respiratory failure-per primary. Currently on [place], no WOB. Possible OSA-per primary.  4.  Hypertension/volume  -Patient is hypertensive today, Has been started o metoprolol 25 mg PO BID decrease dose to 12.5 mg PO since changing diuretic and wish to avoid hypotension. Low sodium diet, fluid restrictions.  5.  Anemia  - HGB 7.2 Tsat 8 11/27/18 has rec'd one dose Feraheme.  Follow HGB, transfuse if HGB < 7.  6.  Metabolic bone disease -  Ca 8.5 Phos 5.7. No binders, yet, start renal diet.  7.  Nutrition -No recent albumin-was 1.3 on admission. Recheck labs. Fluid restrictions.   Payal Stanforth H. Owens Shark, NP-C 11/29/2018, 12:53 PM  [employer] [phone]

## 2018-11-29 NOTE — Progress Notes (Signed)
Progress Note  Patient Name: Marcia Saunders Date of Encounter: 11/29/2018  Primary Cardiologist: Dr Sallyanne Kuster  Subjective   No CP or dyspnea  Inpatient Medications    Scheduled Meds: . sodium chloride   Intravenous Once  . Chlorhexidine Gluconate Cloth  6 each Topical Daily  . feeding supplement  1 Container Oral TID BM  . furosemide  80 mg Intravenous Q12H  . ipratropium  0.5 mg Nebulization TID  . levalbuterol  0.63 mg Nebulization TID  . lidocaine  1 patch Transdermal Q24H  . mouth rinse  15 mL Mouth Rinse BID  . pantoprazole (PROTONIX) IV  40 mg Intravenous Q12H   Continuous Infusions: . sodium chloride     PRN Meds: Place/Maintain arterial line **AND** sodium chloride, acetaminophen, fentaNYL (SUBLIMAZE) injection, metoprolol tartrate, ondansetron (ZOFRAN) IV, phenol   Vital Signs    Vitals:   11/29/18 0231 11/29/18 0418 11/29/18 0448 11/29/18 0743  BP: 131/75 (!) 161/87    Pulse: 71 71    Resp: 16 15    Temp: (!) 97.4 F (36.3 C) (!) 97.3 F (36.3 C)    TempSrc: Axillary Axillary    SpO2: 97% 98%  99%  Weight:   109.1 kg   Height:        Intake/Output Summary (Last 24 hours) at 11/29/2018 0751 Last data filed at 11/29/2018 0400 Gross per 24 hour  Intake 840 ml  Output 950 ml  Net -110 ml   Last 3 Weights 11/29/2018 11/28/2018 11/27/2018  Weight (lbs) 240 lb 8.4 oz 239 lb 3.2 oz 241 lb 3.2 oz  Weight (kg) 109.1 kg 108.5 kg 109.408 kg      Telemetry    Sinus with PVCs and short run of atrial fibrillation- Personally Reviewed   Physical Exam   GEN: NAD obese Neck: supple, JVP difficult Cardiac: RRR Respiratory: CTA GI: Soft, NT/ND; abdominal wall edema still present, no masses MS: 3+ edema. Neuro:  No focal findings  Labs    High Sensitivity Troponin:   Recent Labs  Lab 11/23/18 0007 11/23/18 0421 11/23/18 1255 11/23/18 1710 11/23/18 1933  TROPONINIHS 1,721* 2,378* 4,017* 3,844* 3,802*      Chemistry Recent Labs  Lab 11/22/18  1640  11/27/18 0546 11/28/18 0630 11/29/18 0550  NA 132*   < > 136 138 138  K 4.8   < > 4.7 4.4 4.2  CL 97*   < > 99 101 100  CO2 17*   < > 25 25 24   GLUCOSE 93   < > 107* 104* 102*  BUN 27*   < > 68* 75* 81*  CREATININE 1.14*   < > 3.18* 3.49* 3.64*  CALCIUM 7.3*   < > 8.1* 8.3* 8.5*  PROT 3.3*  --   --   --   --   ALBUMIN 1.3*  --   --   --   --   AST 151*  --   --   --   --   ALT 77*  --   --   --   --   ALKPHOS 59  --   --   --   --   BILITOT 0.6  --   --   --   --   GFRNONAA 44*   < > 13* 11* 11*  GFRAA 50*   < > 15* 13* 12*  ANIONGAP 18*   < > 12 12 14    < > = values in this interval not displayed.  Hematology Recent Labs  Lab 11/27/18 0546 11/27/18 2253 11/28/18 0630 11/29/18 0550  WBC 9.5 8.4 8.5  --   RBC 2.85* 2.78* 2.80*  --   HGB 7.8* 7.9* 7.5* 10.6*  HCT 24.5* 24.1* 24.7* 32.1*  MCV 86.0 86.7 88.2  --   MCH 27.4 28.4 26.8  --   MCHC 31.8 32.8 30.4  --   RDW 19.7* 20.0* 19.9*  --   PLT 131* 142* 156  --     BNP Recent Labs  Lab 11/22/18 1640  BNP 739.2*     DDimer  Recent Labs  Lab 11/23/18 0007  DDIMER 5.03*     Patient Profile     85 y.o. 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   Assessment & Plan    1 demand non-ST elevation myocardial infarction-event occurred in the setting of hemoglobin of 4 and cardiac arrest/shock.  Echocardiogram shows no wall motion abnormalities.  No plans for further ischemia evaluation particularly in light of recent GI bleed.  Patient not on aspirin given recent GI bleed (initial hemoglobin 4).  Add metoprolol 25 mg BID; add crestor.  2 right heart failure/anasarca-diffuse anasarca remains.  This is felt to be multifactorial including right heart  failure (OHS/OSA), acute renal failure and hypoalbuminemia. I/O -110. Patient remains markedly volume overloaded.  Would increase Lasix to 120 mg IV twice daily and follow renal function.  3 acute renal insufficiency-creatinine increased to 3.64 today. Acute renal insufficiency is likely secondary to recent acute events (ATN from anemia-hemoglobin of 4 at time of admission- and post cardiac arrest).  Will consult nephrology.  4 acute blood loss anemia-hemoglobin 10.6 today.  Transfuse as needed.  Patient has been evaluated by gastroenterology with no plans for endoscopy.  For questions or updates, please contact [facility] Please consult [email or web address] for contact info under        Signed, Olga Millers, MD  11/29/2018, 7:51 AM

## 2018-11-29 NOTE — Progress Notes (Signed)
[facility] PROGRESS NOTE    Progress Note  Marcia Saunders  NFA:[id number] DOB: 07/20/1930 DOA: 11/22/2018 PCP: Patient, No Pcp Per     Brief Narrative:   Marcia Saunders is an 85 y.o. female with an unknown past medical history who was admitted on 11/22/2018 after cardiac arrest presumed hemorrhagic stroke from a GI bleed.  Significant events: [facility] got 4 units of packed red blood cells 2 units of fresh frozen plasma postarrest and started on IV pressors. 11/23/2018 seen by cards and cardiology. 11/24/2018 off pressors received Lasix hemoglobin has remained stable. 11/24/2018 self extubated and tolerated well.  Procedures: ET tube 11/22/2018-10-20 20. A-line 11/22/2018  Significant diagnostic studies: Chest x-ray 11/22/2018 atelectasis CT scan of the abdomen and pelvis 11/23/2018 show anasarca bilateral airspace disease. 11/23/2018 EF of 60% left atrium dilated right atrial pressure 3 mmHg. 11/22/2018- SARS-CoV-2 PCR.  Anti-infective therapy: Rocephin 9/30 20201 dose.  Assessment/Plan:   Status post cardiac arrest in the setting of suspected hemorrhagic shock due to lower GI bleed/acute blood loss anemia: She status post 4 units of packed red blood cells so she presented with a hemoglobin of 4. GI was consulted who recommended no further intervention. She will need to be on iron sulfate as an outpatient.  She received IV Feraheme in house.  Acute hypoxic and hypercarbic respiratory failure possibly due to underlying obstructive sleep apnea, pulmonary edema and bilateral pleural effusion: As per bedside nurse she became severely hypoxic with her saturations dropping in the 70s. Chest x-ray and CT scan done during this time showed pulmonary edema and small bilateral pleural effusions. He satting greater 96% on 3 L of nasal cannula. Continue BiPAP at night, since spirometry and flutter valve.  Paroxysmal atrial fibrillation: She received a dose  of IV Lopressor and converted back to sinus rhythm. Cardiology was consulted and due to her recent GI bleed.  Elevated cardiac biomarkers: In the setting of a drop in hemoglobin and cardiac arrest echocardiogram showed no wall motion abnormalities, cardiology recommended no further ischemic evaluation particularly in the light of recent GI bleed. Patient not on aspirin due to recent GI bleed continue metoprolol twice daily and Crestor.  Right-sided heart failure/anasarca: Felt to be multifactorial in the setting of obstructive sleep apnea acute renal failure and hypoalbuminemia. The patient remains fluid overloaded cardiology recommended to continue Lasix IV twice daily. Cardiology assisting continue strict I's and O's Daily weight and fluid restriction.  AKI/Possible ATN: On admission her creatinine was 1.1. In the setting of a drop in hemoglobin suddenly of 4 and post cardiac arrest. Patient is being diuresed and renal function continues to rise. Since her respiratory status has stabilized and she is currently on 3 L of oxygen, I believe we could hold on on diuresis, will consult nephrology.    DVT prophylaxis: SCD Family Communication:daughter Disposition Plan/Barrier to D/C: Unable to determine. Code Status:     Code Status Orders  (From admission, onward)         Start     Ordered   11/22/18 1830  Full code  Continuous     11/22/18 1830        Code Status History    This patient has a current code status but no historical code status.   Advance Care Planning Activity        IV Access:    Peripheral IV   Procedures and diagnostic studies:   Dg Chest Port 1 View  Result Date:  11/28/2018 CLINICAL DATA:  Hypoxia. EXAM: PORTABLE CHEST 1 VIEW COMPARISON:  Radiographs 11/24/2018 and 11/23/2018. FINDINGS: 1356 hours. Patient remains rotated to the left. The endotracheal tube and enteric tube have been removed in the interval. The left IJ central venous catheter is  unchanged, projecting to the mid SVC level. There is stable cardiomegaly with increasing pulmonary edema and bilateral pleural effusions. No evidence of pneumothorax. The bones appear unchanged. Telemetry leads overlie the chest. IMPRESSION: 1. Worsened pulmonary edema and pleural effusions consistent with congestive heart failure. 2. Stable cardiomegaly. Electronically Signed   By: Carey Bullocks M.D.   On: 11/28/2018 15:47     Medical Consultants:    None.  Anti-Infectives:   None  Subjective:    Marcia Juneau she relates his breathing is better than yesterday.  Objective:    Vitals:   11/29/18 0418 11/29/18 0448 11/29/18 0743 11/29/18 0808  BP: (!) 161/87   (!) 150/82  Pulse: 71   71  Resp: 15   17  Temp: (!) 97.3 F (36.3 C)   97.7 F (36.5 C)  TempSrc: Axillary   Oral  SpO2: 98%  99% 99%  Weight:  109.1 kg    Height:       SpO2: 99 % O2 Flow Rate (L/min): 3 L/min FiO2 (%): 100 %   Intake/Output Summary (Last 24 hours) at 11/29/2018 0935 Last data filed at 11/29/2018 0852 Gross per 24 hour  Intake 1020 ml  Output 950 ml  Net 70 ml   Filed Weights   11/27/18 0355 11/28/18 0315 11/29/18 0448  Weight: 109.4 kg 108.5 kg 109.1 kg    Exam: General exam: In no acute distress. Respiratory system: Good air movement and crackles bilaterally Cardiovascular system: S1 & S2 heard, RRR. No JVD. Gastrointestinal system: Abdomen is nondistended, soft and nontender.  Central nervous system: Alert and oriented. No focal neurological deficits. Extremities: No pedal edema. Skin: No rashes, lesions or ulcers  Data Reviewed:    Labs: Basic Metabolic Panel: Recent Labs  Lab 11/24/18 1001 11/24/18 1518 11/25/18 0307  11/25/18 1823 11/26/18 0501 11/27/18 0546 11/28/18 0630 11/29/18 0550  NA 134* 133* 134*   < > 132* 135 136 138 138  K 4.0 3.9 3.8   < > 3.6 3.9 4.7 4.4 4.2  CL 100 99 100  --  97* 97* 99 101 100  CO2 24 24 25   --  24 25 25 25 24   GLUCOSE 83  81 75  --  107* 105* 107* 104* 102*  BUN 49* 51* 54*  --  57* 63* 68* 75* 81*  CREATININE 1.83* 1.86* 2.48*  --  2.61* 2.92* 3.18* 3.49* 3.64*  CALCIUM 7.6* 7.5* 7.8*  --  7.7* 7.9* 8.1* 8.3* 8.5*  MG 1.7 1.8 1.8  --  1.9  --  2.0  --   --   PHOS 4.5 4.5 4.9*  --  5.0*  --  5.7*  --   --    < > = values in this interval not displayed.   GFR Estimated Creatinine Clearance: 13.9 mL/min (A) (by C-G formula based on SCr of 3.64 mg/dL (H)). Liver Function Tests: Recent Labs  Lab 11/22/18 1640  AST 151*  ALT 77*  ALKPHOS 59  BILITOT 0.6  PROT 3.3*  ALBUMIN 1.3*   No results for input(s): LIPASE, AMYLASE in the last 168 hours. No results for input(s): AMMONIA in the last 168 hours. Coagulation profile Recent Labs  Lab 11/23/18 0007 11/23/18  0421 11/24/18 1001 11/25/18 0307 11/27/18 0546  INR 1.7* 1.6* 1.5* 1.3* 1.3*   COVID-19 Labs  Recent Labs    11/27/18 1700  FERRITIN 95    Lab Results  Component Value Date   SARSCOV2NAA NEGATIVE 11/22/2018    CBC: Recent Labs  Lab 11/22/18 1640  11/24/18 2000  11/26/18 0501 11/27/18 0546 11/27/18 2253 11/28/18 0630 11/29/18 0550  WBC 14.9*   < > 23.7*  --  14.3* 9.5 8.4 8.5  --   NEUTROABS 11.2*  --   --   --   --   --   --   --   --   HGB 4.0*   < > 7.8*   < > 7.7* 7.8* 7.9* 7.5* 10.6*  HCT 12.8*   < > 23.5*   < > 23.5* 24.5* 24.1* 24.7* 32.1*  MCV 80.0   < > 82.5  --  86.1 86.0 86.7 88.2  --   PLT 144*   < > 125*  --  123* 131* 142* 156  --    < > = values in this interval not displayed.   Cardiac Enzymes: No results for input(s): CKTOTAL, CKMB, CKMBINDEX, TROPONINI in the last 168 hours. BNP (last 3 results) No results for input(s): PROBNP in the last 8760 hours. CBG: Recent Labs  Lab 11/28/18 1648 11/28/18 1954 11/28/18 2353 11/29/18 0422 11/29/18 0806  GLUCAP 81 87 98 94 98   D-Dimer: No results for input(s): DDIMER in the last 72 hours. Hgb A1c: No results for input(s): HGBA1C in the last 72 hours.  Lipid Profile: No results for input(s): CHOL, HDL, LDLCALC, TRIG, CHOLHDL, LDLDIRECT in the last 72 hours. Thyroid function studies: No results for input(s): TSH, T4TOTAL, T3FREE, THYROIDAB in the last 72 hours.  Invalid input(s): FREET3 Anemia work up: Recent Labs    11/27/18 1700  FERRITIN 95  TIBC 314  IRON 24*   Sepsis Labs: Recent Labs  Lab 11/22/18 1640 11/23/18 0007  11/23/18 0601 11/23/18 1255  11/26/18 0501 11/27/18 0546 11/27/18 2253 11/28/18 0630  WBC 14.9* 29.6*   < >  --  36.7*   < > 14.3* 9.5 8.4 8.5  LATICACIDVEN >11.0* 3.7*  --  2.4* 2.0*  --   --   --   --   --    < > = values in this interval not displayed.   Microbiology Recent Results (from the past 240 hour(s))  Culture, blood (Routine X 2) w Reflex to ID Panel     Status: None   Collection Time: 11/22/18  4:40 PM   Specimen: BLOOD  Result Value Ref Range Status   Specimen Description BLOOD BLOOD LEFT FOREARM  Final   Special Requests   Final    BOTTLES DRAWN AEROBIC AND ANAEROBIC Blood Culture adequate volume   Culture   Final    NO GROWTH 5 DAYS Performed at [facility], [address] [place], [place] [address]    Report Status 11/27/2018 FINAL  Final  Culture, blood (Routine X 2) w Reflex to ID Panel     Status: None   Collection Time: 11/22/18  4:40 PM   Specimen: BLOOD  Result Value Ref Range Status   Specimen Description BLOOD LEFT ANTECUBITAL  Final   Special Requests   Final    BOTTLES DRAWN AEROBIC AND ANAEROBIC Blood Culture adequate volume   Culture   Final    NO GROWTH 5 DAYS Performed at [facility],  [address] [place], [place] [address]    Report Status 11/27/2018 FINAL  Final  SARS Coronavirus 2 [facility](Hospital order, Performed in [facility] hospital lab) Nasopharyngeal Nasopharyngeal Swab     Status: None   Collection Time: 11/22/18  4:52 PM   Specimen: Nasopharyngeal Swab  Result Value Ref Range Status   SARS Coronavirus 2 NEGATIVE NEGATIVE Final     Comment: (NOTE) If result is NEGATIVE SARS-CoV-2 target nucleic acids are NOT DETECTED. The SARS-CoV-2 RNA is generally detectable in upper and lower  respiratory specimens during the acute phase of infection. The lowest  concentration of SARS-CoV-2 viral copies this assay can detect is 250  copies / mL. A negative result does not preclude SARS-CoV-2 infection  and should not be used as the sole basis for treatment or other  patient management decisions.  A negative result may occur with  improper specimen collection / handling, submission of specimen other  than nasopharyngeal swab, presence of viral mutation(s) within the  areas targeted by this assay, and inadequate number of viral copies  (<250 copies / mL). A negative result must be combined with clinical  observations, patient history, and epidemiological information. If result is POSITIVE SARS-CoV-2 target nucleic acids are DETECTED. The SARS-CoV-2 RNA is generally detectable in upper and lower  respiratory specimens dur ing the acute phase of infection.  Positive  results are indicative of active infection with SARS-CoV-2.  Clinical  correlation with patient history and other diagnostic information is  necessary to determine patient infection status.  Positive results do  not rule out bacterial infection or co-infection with other viruses. If result is PRESUMPTIVE POSTIVE SARS-CoV-2 nucleic acids MAY BE PRESENT.   A presumptive positive result was obtained on the submitted specimen  and confirmed on repeat testing.  While 2019 novel coronavirus  (SARS-CoV-2) nucleic acids may be present in the submitted sample  additional confirmatory testing may be necessary for epidemiological  and / or clinical management purposes  to differentiate between  SARS-CoV-2 and other Sarbecovirus currently known to infect humans.  If clinically indicated additional testing with an alternate test  methodology [id number]) is advised. The SARS-CoV-2  RNA is generally  detectable in upper and lower respiratory sp ecimens during the acute  phase of infection. The expected result is Negative. Fact Sheet for Patients:  BoilerBrush.com.cyhttps://www.fda.gov/media/136312/download Fact Sheet for Healthcare Providers: https://pope.com/https://www.fda.gov/media/136313/download This test is not yet approved or cleared by the [place] FDA and has been authorized for detection and/or diagnosis of SARS-CoV-2 by FDA under an Emergency Use Authorization (EUA).  This EUA will remain in effect (meaning this test can be used) for the duration of the COVID-19 declaration under Section 564(b)(1) of the Act, 21 U.S.C. section 360bbb-3(b)(1), unless the authorization is terminated or revoked sooner. Performed at [facility], [address] [place], [place] [address]   MRSA PCR Screening     Status: None   Collection Time: 11/22/18  9:59 PM   Specimen: Nasal Mucosa; Nasopharyngeal  Result Value Ref Range Status   MRSA by PCR NEGATIVE NEGATIVE Final    Comment:        The GeneXpert MRSA Assay (FDA approved for NASAL specimens only), is one component of a comprehensive MRSA colonization surveillance program. It is not intended to diagnose MRSA infection nor to guide or monitor treatment for MRSA infections. Performed at [facility], [address] [place], [place] [address]   Culture, respiratory (non-expectorated)     Status: None  Collection Time: 11/24/18  8:27 AM   Specimen: Tracheal Aspirate; Respiratory  Result Value Ref Range Status   Specimen Description TRACHEAL ASPIRATE  Final   Special Requests NONE  Final   Gram Stain   Final    RARE WBC PRESENT, PREDOMINANTLY MONONUCLEAR NO ORGANISMS SEEN Performed at [facility], [address], [place], [place] [address]    Culture RARE CANDIDA ALBICANS  Final   Report Status 11/27/2018 FINAL  Final     Medications:   . Chlorhexidine Gluconate Cloth  6 each Topical Daily  . feeding  supplement  1 Container Oral TID BM  . ipratropium  0.5 mg Nebulization TID  . levalbuterol  0.63 mg Nebulization TID  . lidocaine  1 patch Transdermal Q24H  . mouth rinse  15 mL Mouth Rinse BID  . metoprolol tartrate  25 mg Oral BID  . pantoprazole (PROTONIX) IV  40 mg Intravenous Q12H  . rosuvastatin  10 mg Oral q1800   Continuous Infusions: . sodium chloride    . furosemide 120 mg (11/29/18 0919)      LOS: 7 days   Charlynne Cousins  [facility]  11/29/2018, 9:35 AM

## 2018-11-29 NOTE — Progress Notes (Signed)
  Speech Language Pathology Treatment: Dysphagia  Patient Details Name: Marcia Saunders MRN: [id number] DOB: 16-Sep-1930 Today's Date: 11/29/2018 Time: 0017-4944 SLP Time Calculation (min) (ACUTE ONLY): 12 min  Assessment / Plan / Recommendation Clinical Impression  Pt was encountered asleep in bed and she remained lethargic throughout this tx session despite max verbal and tactile cues.  Pt was observed with one trial of thin liquid via straw pipette and she exhibited L anterior labial spillage and delayed swallow initiation (suspect secondary to lethargy).  No clinical s/sx of aspiration were observed.  Pt refused additional liquid and solid trials despite encouragement.  SLP then completed oral care with suction swab and pt tolerated it without difficulty.  Recommend continuation of Dysphagia 2 solids and thin liquid with medications whole or crushed in puree.  Additionally recommend that pt only consume po when awake/alert.  SLP will continue to monitor diet tolerance and evaluate readiness for clinical diet upgrade.     HPI HPI: 85 y.o. female who was admitted 9/30 after cardiac arrest presumed due to hemorrhagic shock from GI bleed      SLP Plan  Continue with current plan of care       Recommendations  Diet recommendations: Thin liquid;Dysphagia 2 (fine chop) Liquids provided via: Straw Medication Administration: Whole meds with puree(Or crushed if necessary ) Supervision: Staff to assist with self feeding Compensations: Minimize environmental distractions;Slow rate;Small sips/bites Postural Changes and/or Swallow Maneuvers: Seated upright 90 degrees                Oral Care Recommendations: Oral care BID Follow up Recommendations: Skilled Nursing facility SLP Visit Diagnosis: Dysphagia, unspecified (R13.10) Plan: Continue with current plan of care       Bretta Bang, M.S., [place] Office: [phone]               [place] 11/29/2018, 11:24 AM

## 2018-11-29 NOTE — Progress Notes (Signed)
Patient is lethargic and hard to verbally arouse. Patient able to open her eyes with physical stimulation. Patient placed on Bipap at the beginning of the shift. CBG 118  Vitals WDL. Paged [facility], got orders for repeat ABG, continue Bipap. Will continue to closely monitor patient.

## 2018-11-30 ENCOUNTER — Inpatient Hospital Stay (HOSPITAL_COMMUNITY): Payer: Medicare Other

## 2018-11-30 LAB — GLUCOSE, CAPILLARY
Glucose-Capillary: 105 mg/dL — ABNORMAL HIGH (ref 70–99)
Glucose-Capillary: 124 mg/dL — ABNORMAL HIGH (ref 70–99)
Glucose-Capillary: 61 mg/dL — ABNORMAL LOW (ref 70–99)
Glucose-Capillary: 71 mg/dL (ref 70–99)
Glucose-Capillary: 89 mg/dL (ref 70–99)
Glucose-Capillary: 90 mg/dL (ref 70–99)
Glucose-Capillary: 91 mg/dL (ref 70–99)
Glucose-Capillary: 95 mg/dL (ref 70–99)

## 2018-11-30 LAB — HEPATIC FUNCTION PANEL
ALT: 46 U/L — ABNORMAL HIGH (ref 0–44)
AST: 28 U/L (ref 15–41)
Albumin: 1.9 g/dL — ABNORMAL LOW (ref 3.5–5.0)
Alkaline Phosphatase: 86 U/L (ref 38–126)
Bilirubin, Direct: 0.7 mg/dL — ABNORMAL HIGH (ref 0.0–0.2)
Indirect Bilirubin: 0.7 mg/dL (ref 0.3–0.9)
Total Bilirubin: 1.4 mg/dL — ABNORMAL HIGH (ref 0.3–1.2)
Total Protein: 5 g/dL — ABNORMAL LOW (ref 6.5–8.1)

## 2018-11-30 LAB — RENAL FUNCTION PANEL
Albumin: 2 g/dL — ABNORMAL LOW (ref 3.5–5.0)
Anion gap: 15 (ref 5–15)
BUN: 83 mg/dL — ABNORMAL HIGH (ref 8–23)
CO2: 27 mmol/L (ref 22–32)
Calcium: 8.9 mg/dL (ref 8.9–10.3)
Chloride: 100 mmol/L (ref 98–111)
Creatinine, Ser: 3.48 mg/dL — ABNORMAL HIGH (ref 0.44–1.00)
GFR calc Af Amer: 13 mL/min — ABNORMAL LOW (ref >60)
GFR calc non Af Amer: 11 mL/min — ABNORMAL LOW (ref >60)
Glucose, Bld: 114 mg/dL — ABNORMAL HIGH (ref 70–99)
Phosphorus: 5.9 mg/dL — ABNORMAL HIGH (ref 2.5–4.6)
Potassium: 3.6 mmol/L (ref 3.5–5.1)
Sodium: 142 mmol/L (ref 135–145)

## 2018-11-30 LAB — CBC
HCT: 32.2 % — ABNORMAL LOW (ref 36.0–46.0)
Hemoglobin: 10.4 g/dL — ABNORMAL LOW (ref 12.0–15.0)
MCH: 28.7 pg (ref 26.0–34.0)
MCHC: 32.3 g/dL (ref 30.0–36.0)
MCV: 88.7 fL (ref 80.0–100.0)
Platelets: 191 10*3/uL (ref 150–400)
RBC: 3.63 MIL/uL — ABNORMAL LOW (ref 3.87–5.11)
RDW: 18.9 % — ABNORMAL HIGH (ref 11.5–15.5)
WBC: 8.9 10*3/uL (ref 4.0–10.5)
nRBC: 1.7 % — ABNORMAL HIGH (ref 0.0–0.2)

## 2018-11-30 LAB — BLOOD GAS, ARTERIAL
Acid-Base Excess: 3.6 mmol/L — ABNORMAL HIGH (ref 0.0–2.0)
Acid-Base Excess: 4.4 mmol/L — ABNORMAL HIGH (ref 0.0–2.0)
Bicarbonate: 28.8 mmol/L — ABNORMAL HIGH (ref 20.0–28.0)
Bicarbonate: 29.6 mmol/L — ABNORMAL HIGH (ref 20.0–28.0)
Delivery systems: POSITIVE
Delivery systems: POSITIVE
Drawn by: 336831
Drawn by: 398991
Expiratory PAP: 5
Expiratory PAP: 4
FIO2: 5
FIO2: 40
Inspiratory PAP: 10
Inspiratory PAP: 16
O2 Saturation: 94.8 %
O2 Saturation: 92.2 %
Patient temperature: 98.6
Patient temperature: 98.6
pCO2 arterial: 54.4 mmHg — ABNORMAL HIGH (ref 32.0–48.0)
pCO2 arterial: 54.5 mmHg — ABNORMAL HIGH (ref 32.0–48.0)
pH, Arterial: 7.344 — ABNORMAL LOW (ref 7.350–7.450)
pH, Arterial: 7.354 (ref 7.350–7.450)
pO2, Arterial: 76.6 mmHg — ABNORMAL LOW (ref 83.0–108.0)
pO2, Arterial: 66.9 mmHg — ABNORMAL LOW (ref 83.0–108.0)

## 2018-11-30 MED ORDER — METOLAZONE 5 MG PO TABS
5.0000 mg | ORAL_TABLET | Freq: Once | ORAL | Status: DC
  Filled 2018-11-30: qty 1

## 2018-11-30 NOTE — Progress Notes (Addendum)
[facility] PROGRESS NOTE    Progress Note  Marcia Saunders  ZOX:[id number] DOB: 12/27/1930 DOA: 11/22/2018 PCP: Patient, No Pcp Per     Brief Narrative:   Marcia Saunders is an 85 y.o. female with an unknown past medical history who was admitted on 11/22/2018 after cardiac arrest presumed hemorrhagic stroke from a GI bleed.  Significant events: [facility] got 4 units of packed red blood cells 2 units of fresh frozen plasma postarrest and started on IV pressors. 11/23/2018 seen by cards and cardiology. 11/24/2018 off pressors received Lasix hemoglobin has remained stable. 11/24/2018 self extubated and tolerated well.  Procedures: ET tube 11/22/2018-10-20 20. A-line 11/22/2018  Significant diagnostic studies: Chest x-ray 11/22/2018 atelectasis CT scan of the abdomen and pelvis 11/23/2018 show anasarca bilateral airspace disease. 11/23/2018 EF of 60% left atrium dilated right atrial pressure 3 mmHg. 11/22/2018- SARS-CoV-2 PCR.  Anti-infective therapy: Rocephin 9/30 20201 dose.  Assessment/Plan:   Status post cardiac arrest in the setting of suspected hemorrhagic shock due to lower GI bleed/acute blood loss anemia: She status post 4 units of packed red blood cells so she presented with a hemoglobin of 4. GI was consulted who recommended no further intervention. She will need to be on iron sulfate as an outpatient.  She received IV Feraheme in house. We will consult hospice and palliative care.  Acute hypoxic and hypercarbic respiratory failure possibly due to underlying obstructive sleep apnea, pulmonary edema and bilateral pleural effusion: New acute respiratory failure with hypercarbia,  she has obstructive sleep apnea for which she uses BiPAP at night she was placed on BiPAP overnight and ABG was done that showed a pH of 7.33/50 3/75. Overnight she became encephalopathic and after receiving ABG was placed on BiPAP. See below for further details.   Paroxysmal atrial fibrillation: Continue current regimen appreciate cardiology's assistance, she was not a candidate for anticoagulation due to her recent GI bleed.  Elevated cardiac biomarkers: In the setting of a drop in hemoglobin and cardiac arrest likely demand ischemia 2D echo showed no wall motion abnormality, cardiology was consulted and recommended no further ischemic work-up. Avoid aspirin due to recent GI bleed continue Crestor and metoprolol.  Right-sided heart failure/anasarca: Will hol;d on to diuresis, BUN is 80.  AKI/Possible ATN: On admission her creatinine was 1.1. In the setting of a drop in hemoglobin suddenly of 4 and post cardiac arrest. Nephrology was consulted, they recommended to continue with IV Lasix and metolazone orally, his creatinine has plateaued.  Acute encephalopathy: She has remained afebrile with no leukocytosis and ABG was done overnight that showed A pH of 7.33/50 3/75, she is definitely retaining CO2, agreed to continue BiPAP that was started overnight. Repeated ABG off the BiPAP showed similar gas, she is not having any cardiac ischemic episode, she is in heart failure and she is being diuresed oxygen saturation is well above 90, she does not have liver failure, her renal function is rising and is probably due with diuresis but her BUN is 80, which is likely the cause of her necephalopathy.  Blood glucose seems to be stable electrolytes are within their limits and she does not appear to be infected.  She has not received any medications which will sedate her and she is not hypothermic or hypothermic, and there is no history of drugs. Check a chest x-ray CT scan and hold her diuresis for now.  DVT prophylaxis: SCD's Family Communication:daughter Disposition Plan/Barrier to D/C: Unable to determine. Code Status:  Code Status Orders  (From admission, onward)         Start     Ordered   11/22/18 1830  Full code  Continuous     11/22/18 1830         Code Status History    This patient has a current code status but no historical code status.   Advance Care Planning Activity        IV Access:    Peripheral IV   Procedures and diagnostic studies:   Dg Chest Port 1 View  Result Date: 11/28/2018 CLINICAL DATA:  Hypoxia. EXAM: PORTABLE CHEST 1 VIEW COMPARISON:  Radiographs 11/24/2018 and 11/23/2018. FINDINGS: 1356 hours. Patient remains rotated to the left. The endotracheal tube and enteric tube have been removed in the interval. The left IJ central venous catheter is unchanged, projecting to the mid SVC level. There is stable cardiomegaly with increasing pulmonary edema and bilateral pleural effusions. No evidence of pneumothorax. The bones appear unchanged. Telemetry leads overlie the chest. IMPRESSION: 1. Worsened pulmonary edema and pleural effusions consistent with congestive heart failure. 2. Stable cardiomegaly. Electronically Signed   By: Richardean Sale M.D.   On: 11/28/2018 15:47     Medical Consultants:    None.  Anti-Infectives:   None  Subjective:    Marcia Saunders she is able to answer simple yes/no questions.  Objective:    Vitals:   11/29/18 1943 11/29/18 2035 11/30/18 0029 11/30/18 0502  BP:  133/78 136/75 (!) 155/93  Pulse: 65 70 70 66  Resp: 17 18 14 14   Temp:  (!) 97.4 F (36.3 C) 97.6 F (36.4 C) (!) 97.5 F (36.4 C)  TempSrc:  Axillary Axillary Axillary  SpO2: 95% 98% 94% 94%  Weight:    110.1 kg  Height:       SpO2: 94 % O2 Flow Rate (L/min): 5 L/min FiO2 (%): 100 %   Intake/Output Summary (Last 24 hours) at 11/30/2018 0758 Last data filed at 11/30/2018 0502 Gross per 24 hour  Intake 440 ml  Output 2425 ml  Net -1985 ml   Filed Weights   11/28/18 0315 11/29/18 0448 11/30/18 0502  Weight: 108.5 kg 109.1 kg 110.1 kg    Exam: General exam: In no acute distress. Respiratory system: She has good air movement with crackles at bases. Cardiovascular system: Regular rate  and rhythm with positive S1-S2 no murmurs rubs gallops, she is definitely fluid overloaded she has 3+ pitting edema. Gastrointestinal system: Abdomen is nondistended, soft and nontender.  Central nervous system: Alert and oriented x2 no focal neurological deficits. Extremities: 3+ edema. Skin: No rashes, lesions or ulcers Psychiatry: Judgment and insight appear poor.  She is able to answer yes and no questions.   Data Reviewed:    Labs: Basic Metabolic Panel: Recent Labs  Lab 11/24/18 1001 11/24/18 1518 11/25/18 0307  11/25/18 1823 11/26/18 0501 11/27/18 0546 11/28/18 0630 11/29/18 0550 11/30/18 0513  NA 134* 133* 134*   < > 132* 135 136 138 138 142  K 4.0 3.9 3.8   < > 3.6 3.9 4.7 4.4 4.2 3.6  CL 100 99 100  --  97* 97* 99 101 100 100  CO2 24 24 25   --  24 25 25 25 24 27   GLUCOSE 83 81 75  --  107* 105* 107* 104* 102* 114*  BUN 49* 51* 54*  --  57* 63* 68* 75* 81* 83*  CREATININE 1.83* 1.86* 2.48*  --  2.61* 2.92* 3.18*  3.49* 3.64* 3.48*  CALCIUM 7.6* 7.5* 7.8*  --  7.7* 7.9* 8.1* 8.3* 8.5* 8.9  MG 1.7 1.8 1.8  --  1.9  --  2.0  --   --   --   PHOS 4.5 4.5 4.9*  --  5.0*  --  5.7*  --   --  5.9*   < > = values in this interval not displayed.   GFR Estimated Creatinine Clearance: 14.6 mL/min (A) (by C-G formula based on SCr of 3.48 mg/dL (H)). Liver Function Tests: Recent Labs  Lab 11/30/18 0513  ALBUMIN 2.0*   No results for input(s): LIPASE, AMYLASE in the last 168 hours. No results for input(s): AMMONIA in the last 168 hours. Coagulation profile Recent Labs  Lab 11/24/18 1001 11/25/18 0307 11/27/18 0546  INR 1.5* 1.3* 1.3*   COVID-19 Labs  Recent Labs    11/27/18 1700  FERRITIN 95    Lab Results  Component Value Date   SARSCOV2NAA NEGATIVE 11/22/2018    CBC: Recent Labs  Lab 11/26/18 0501 11/27/18 0546 11/27/18 2253 11/28/18 0630 11/29/18 0550 11/30/18 0513  WBC 14.3* 9.5 8.4 8.5  --  8.9  HGB 7.7* 7.8* 7.9* 7.5* 10.6* 10.4*  HCT 23.5*  24.5* 24.1* 24.7* 32.1* 32.2*  MCV 86.1 86.0 86.7 88.2  --  88.7  PLT 123* 131* 142* 156  --  191   Cardiac Enzymes: No results for input(s): CKTOTAL, CKMB, CKMBINDEX, TROPONINI in the last 168 hours. BNP (last 3 results) No results for input(s): PROBNP in the last 8760 hours. CBG: Recent Labs  Lab 11/29/18 2045 11/29/18 2055 11/30/18 0024 11/30/18 0513 11/30/18 0735  GLUCAP 118* 128* 124* 91 105*   D-Dimer: No results for input(s): DDIMER in the last 72 hours. Hgb A1c: No results for input(s): HGBA1C in the last 72 hours. Lipid Profile: No results for input(s): CHOL, HDL, LDLCALC, TRIG, CHOLHDL, LDLDIRECT in the last 72 hours. Thyroid function studies: No results for input(s): TSH, T4TOTAL, T3FREE, THYROIDAB in the last 72 hours.  Invalid input(s): FREET3 Anemia work up: Recent Labs    11/27/18 1700  FERRITIN 95  TIBC 314  IRON 24*   Sepsis Labs: Recent Labs  Lab 11/23/18 1255  11/27/18 0546 11/27/18 2253 11/28/18 0630 11/30/18 0513  WBC 36.7*   < > 9.5 8.4 8.5 8.9  LATICACIDVEN 2.0*  --   --   --   --   --    < > = values in this interval not displayed.   Microbiology Recent Results (from the past 240 hour(s))  Culture, blood (Routine X 2) w Reflex to ID Panel     Status: None   Collection Time: 11/22/18  4:40 PM   Specimen: BLOOD  Result Value Ref Range Status   Specimen Description BLOOD BLOOD LEFT FOREARM  Final   Special Requests   Final    BOTTLES DRAWN AEROBIC AND ANAEROBIC Blood Culture adequate volume   Culture   Final    NO GROWTH 5 DAYS Performed at [facility], [address] [place], [place] [address]    Report Status 11/27/2018 FINAL  Final  Culture, blood (Routine X 2) w Reflex to ID Panel     Status: None   Collection Time: 11/22/18  4:40 PM   Specimen: BLOOD  Result Value Ref Range Status   Specimen Description BLOOD LEFT ANTECUBITAL  Final   Special Requests   Final    BOTTLES DRAWN AEROBIC AND ANAEROBIC Blood Culture  adequate volume   Culture   Final    NO GROWTH 5 DAYS Performed at [facility], [address] [place], [place] [address]    Report Status 11/27/2018 FINAL  Final  SARS Coronavirus 2 [facility] order, Performed in [facility] hospital lab) Nasopharyngeal Nasopharyngeal Swab     Status: None   Collection Time: 11/22/18  4:52 PM   Specimen: Nasopharyngeal Swab  Result Value Ref Range Status   SARS Coronavirus 2 NEGATIVE NEGATIVE Final    Comment: (NOTE) If result is NEGATIVE SARS-CoV-2 target nucleic acids are NOT DETECTED. The SARS-CoV-2 RNA is generally detectable in upper and lower  respiratory specimens during the acute phase of infection. The lowest  concentration of SARS-CoV-2 viral copies this assay can detect is 250  copies / mL. A negative result does not preclude SARS-CoV-2 infection  and should not be used as the sole basis for treatment or other  patient management decisions.  A negative result may occur with  improper specimen collection / handling, submission of specimen other  than nasopharyngeal swab, presence of viral mutation(s) within the  areas targeted by this assay, and inadequate number of viral copies  (<250 copies / mL). A negative result must be combined with clinical  observations, patient history, and epidemiological information. If result is POSITIVE SARS-CoV-2 target nucleic acids are DETECTED. The SARS-CoV-2 RNA is generally detectable in upper and lower  respiratory specimens dur ing the acute phase of infection.  Positive  results are indicative of active infection with SARS-CoV-2.  Clinical  correlation with patient history and other diagnostic information is  necessary to determine patient infection status.  Positive results do  not rule out bacterial infection or co-infection with other viruses. If result is PRESUMPTIVE POSTIVE SARS-CoV-2 nucleic acids MAY BE PRESENT.   A presumptive positive result was obtained on the submitted specimen   and confirmed on repeat testing.  While 2019 novel coronavirus  (SARS-CoV-2) nucleic acids may be present in the submitted sample  additional confirmatory testing may be necessary for epidemiological  and / or clinical management purposes  to differentiate between  SARS-CoV-2 and other Sarbecovirus currently known to infect humans.  If clinically indicated additional testing with an alternate test  methodology [phone]) is advised. The SARS-CoV-2 RNA is generally  detectable in upper and lower respiratory sp ecimens during the acute  phase of infection. The expected result is Negative. Fact Sheet for Patients:  BoilerBrush.com.cy Fact Sheet for Healthcare Providers: https://pope.com/ This test is not yet approved or cleared by the [place] FDA and has been authorized for detection and/or diagnosis of SARS-CoV-2 by FDA under an Emergency Use Authorization (EUA).  This EUA will remain in effect (meaning this test can be used) for the duration of the COVID-19 declaration under Section 564(b)(1) of the Act, 21 U.S.C. section 360bbb-3(b)(1), unless the authorization is terminated or revoked sooner. Performed at [facility], [address] [place], [place] [address]   MRSA PCR Screening     Status: None   Collection Time: 11/22/18  9:59 PM   Specimen: Nasal Mucosa; Nasopharyngeal  Result Value Ref Range Status   MRSA by PCR NEGATIVE NEGATIVE Final    Comment:        The GeneXpert MRSA Assay (FDA approved for NASAL specimens only), is one component of a comprehensive MRSA colonization surveillance program. It is not intended to diagnose MRSA infection nor to guide or monitor treatment for MRSA infections. Performed at [facility], [address] [place], [place] [address]   Culture, respiratory (non-expectorated)     Status: None   Collection Time: 11/24/18  8:27 AM   Specimen: Tracheal Aspirate; Respiratory   Result Value Ref Range Status   Specimen Description TRACHEAL ASPIRATE  Final   Special Requests NONE  Final   Gram Stain   Final    RARE WBC PRESENT, PREDOMINANTLY MONONUCLEAR NO ORGANISMS SEEN Performed at [facility], [address] [place], [place] [address]    Culture RARE CANDIDA ALBICANS  Final   Report Status 11/27/2018 FINAL  Final     Medications:   . Chlorhexidine Gluconate Cloth  6 each Topical Daily  . feeding supplement  1 Container Oral TID BM  . furosemide  80 mg Intravenous BID  . ipratropium  0.5 mg Nebulization TID  . levalbuterol  0.63 mg Nebulization TID  . lidocaine  1 patch Transdermal Q24H  . mouth rinse  15 mL Mouth Rinse BID  . metoprolol tartrate  12.5 mg Oral BID  . pantoprazole (PROTONIX) IV  40 mg Intravenous Q12H  . rosuvastatin  10 mg Oral q1800   Continuous Infusions:     LOS: 8 days   Marinda ElkAbraham Feliz Ortiz  [facility]  11/30/2018, 7:58 AM

## 2018-11-30 NOTE — Progress Notes (Signed)
Patient with continued lethargy.  Patient minimally unresponsive to painful stimuli.  Dr. Venetia Constable notified and aware. No new orders received.

## 2018-11-30 NOTE — Progress Notes (Signed)
Patient pulled Left IJ CVC. Immediately placed pressure  and placed a petroleum dressing on site. Notified [facility], got order to continue to monitor patient. IV team order placed.

## 2018-11-30 NOTE — Progress Notes (Signed)
RN called to bedside by Dr.  Royce Macadamia.  Dr. Royce Macadamia concerned about lethargy and requested Bipap be place.  Respiratory Therapist brandy called.

## 2018-11-30 NOTE — Progress Notes (Signed)
Progress Note  Patient Name: Marcia Saunders Date of Encounter: 11/30/2018  Primary Cardiologist: Dr Royann Shivers  Subjective   More lethargic today; pt denies CP or dyspnea  Inpatient Medications    Scheduled Meds: . Chlorhexidine Gluconate Cloth  6 each Topical Daily  . feeding supplement  1 Container Oral TID BM  . furosemide  80 mg Intravenous BID  . ipratropium  0.5 mg Nebulization TID  . levalbuterol  0.63 mg Nebulization TID  . lidocaine  1 patch Transdermal Q24H  . mouth rinse  15 mL Mouth Rinse BID  . metoprolol tartrate  12.5 mg Oral BID  . pantoprazole (PROTONIX) IV  40 mg Intravenous Q12H  . rosuvastatin  10 mg Oral q1800   Continuous Infusions:  PRN Meds: acetaminophen, fentaNYL (SUBLIMAZE) injection, metoprolol tartrate, ondansetron (ZOFRAN) IV, phenol   Vital Signs    Vitals:   11/29/18 1943 11/29/18 2035 11/30/18 0029 11/30/18 0502  BP:  133/78 136/75 (!) 155/93  Pulse: 65 70 70 66  Resp: 17 18 14 14   Temp:  (!) 97.4 F (36.3 C) 97.6 F (36.4 C) (!) 97.5 F (36.4 C)  TempSrc:  Axillary Axillary Axillary  SpO2: 95% 98% 94% 94%  Weight:    110.1 kg  Height:        Intake/Output Summary (Last 24 hours) at 11/30/2018 0757 Last data filed at 11/30/2018 0502 Gross per 24 hour  Intake 440 ml  Output 2425 ml  Net -1985 ml   Last 3 Weights 11/30/2018 11/29/2018 11/28/2018  Weight (lbs) 242 lb 12.8 oz 240 lb 8.4 oz 239 lb 3.2 oz  Weight (kg) 110.133 kg 109.1 kg 108.5 kg      Telemetry    Sinus- Personally Reviewed   Physical Exam   GEN: NAD WD obese Neck: supple Cardiac: RRR, no murmur Respiratory: CTA; no wheeze GI: Soft, NT/ND; abdominal wall edema still present, no masses MS: 2+ edema. Neuro:  Mildly lethargic; no focal findings  Labs    High Sensitivity Troponin:   Recent Labs  Lab 11/23/18 0007 11/23/18 0421 11/23/18 1255 11/23/18 1710 11/23/18 1933  TROPONINIHS 1,721* 2,378* 4,017* 3,844* 3,802*      Chemistry Recent Labs   Lab 11/28/18 0630 11/29/18 0550 11/30/18 0513  NA 138 138 142  K 4.4 4.2 3.6  CL 101 100 100  CO2 25 24 27   GLUCOSE 104* 102* 114*  BUN 75* 81* 83*  CREATININE 3.49* 3.64* 3.48*  CALCIUM 8.3* 8.5* 8.9  ALBUMIN  --   --  2.0*  GFRNONAA 11* 11* 11*  GFRAA 13* 12* 13*  ANIONGAP 12 14 15      Hematology Recent Labs  Lab 11/27/18 2253 11/28/18 0630 11/29/18 0550 11/30/18 0513  WBC 8.4 8.5  --  8.9  RBC 2.78* 2.80*  --  3.63*  HGB 7.9* 7.5* 10.6* 10.4*  HCT 24.1* 24.7* 32.1* 32.2*  MCV 86.7 88.2  --  88.7  MCH 28.4 26.8  --  28.7  MCHC 32.8 30.4  --  32.3  RDW 20.0* 19.9*  --  18.9*  PLT 142* 156  --  191    Patient Profile     85 y.o. 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   Assessment & Plan    1 demand  non-ST elevation myocardial infarction-event occurred in the setting of hemoglobin of 4 and cardiac arrest/shock.  Echocardiogram shows no wall motion abnormalities.  No plans for further ischemia evaluation particularly in light of recent GI bleed.  Patient not on aspirin given recent GI bleed (initial hemoglobin 4).  Continue beta blocker and statin.  2 right heart failure/anasarca-diffuse anasarca remains.  This is felt to be multifactorial including right heart failure (OHS/OSA), acute renal failure and hypoalbuminemia. I/O -1985. Patient remains volume overloaded.  Continue present dose of lasix and metolazone and follow renal function.  3 acute renal insufficiency-Renal function unchanged today. Acute renal insufficiency is likely secondary to recent acute events (ATN from anemia-hemoglobin of 4 at time of admission- and post cardiac arrest).  Nephrology following.  4 acute blood loss anemia-hemoglobin 10.4  today.  Transfuse as needed.  Patient has been evaluated by gastroenterology with no plans for endoscopy.  5 lethargy-some lethargic this morning.  However there are no gross focal neurological findings.  Some CO2 retention on ABG last evening.  I agree with repeating ABG.  Further evaluation per primary care.  For questions or updates, please contact [facility] Please consult [email or web address] for contact info under        Signed, Kirk Ruths, MD  11/30/2018, 7:57 AM

## 2018-11-30 NOTE — Progress Notes (Signed)
Patients foley catheter leaking, discontinue catheter at 2230 10/78/2020. Will monitor for spontaneous void with in 6 hrs. Patient placed on purewick, will continue to monitor patient.

## 2018-11-30 NOTE — Progress Notes (Signed)
RT called to patient room due to patient being more lethargic and requesting for bipap, through the dream station, to be placed on.  Placed patient on bipap and increased settings from 10/5 to 14/4 to give patient more support.  Will continue to monitor.

## 2018-11-30 NOTE — Progress Notes (Signed)
Physical Therapy Treatment Patient Details Name: Marcia Saunders MRN: [id number] DOB: May 17, 1930 Today's Date: 11/30/2018    History of Present Illness 85 y.o. female who was admitted 9/30 after cardiac arrest presumed due to hemorrhagic shock from GI bleed. Pt intubated 9/30-10/2. Pt also with rt side heart failure. PMH: ckd, cad, htn    PT Comments    Pt with very limited progress. Edematous extremities make mobility difficult. Will need mechanical lift for any OOB.    Follow Up Recommendations  SNF;Supervision/Assistance - 24 hour     Equipment Recommendations  None recommended by PT    Recommendations for Other Services       Precautions / Restrictions Precautions Precautions: Fall Restrictions Weight Bearing Restrictions: No    Mobility  Bed Mobility Overal bed mobility: Needs Assistance Bed Mobility: Supine to Sit;Sit to Supine     Supine to sit: +2 for physical assistance;Total assist Sit to supine: +2 for physical assistance;Total assist   General bed mobility comments: Assist for all aspects  Transfers                 General transfer comment: Will need mechanical lift  Ambulation/Gait                 Stairs             Wheelchair Mobility    Modified Rankin (Stroke Patients Only)       Balance Overall balance assessment: Needs assistance Sitting-balance support: Bilateral upper extremity supported;Feet supported Sitting balance-Leahy Scale: Poor Sitting balance - Comments: Pt sat EOB x 10 minutes with min guard and propped on extended LUE. Not able to sit midline without LUE propped Postural control: Left lateral lean                                  Cognition Arousal/Alertness: Awake/alert Behavior During Therapy: Flat affect Overall Cognitive Status: Impaired/Different from baseline Area of Impairment: Orientation;Attention;Safety/judgement;Following commands;Memory;Awareness;Problem solving                 Orientation Level: Disoriented to;Place;Time;Situation Current Attention Level: Focused Memory: Decreased short-term memory;Decreased recall of precautions Following Commands: Follows one step commands inconsistently;Follows one step commands with increased time Safety/Judgement: Decreased awareness of safety;Decreased awareness of deficits   Problem Solving: Slow processing;Decreased initiation;Difficulty sequencing;Requires verbal cues;Requires tactile cues        Exercises      General Comments        Pertinent Vitals/Pain Pain Assessment: Faces Faces Pain Scale: Hurts a little bit Pain Location: generalized Pain Descriptors / Indicators: Aching;Guarding;Discomfort;Grimacing Pain Intervention(s): Limited activity within patient's tolerance;Monitored during session;Repositioned    Home Living                      Prior Function            PT Goals (current goals can now be found in the care plan section) Progress towards PT goals: Not progressing toward goals - comment    Frequency    Min 2X/week      PT Plan Current plan remains appropriate    Co-evaluation              AM-PAC PT "6 Clicks" Mobility   Outcome Measure  Help needed turning from your back to your side while in a flat bed without using bedrails?: Total Help needed moving from lying on your back to sitting  on the side of a flat bed without using bedrails?: Total Help needed moving to and from a bed to a chair (including a wheelchair)?: Total Help needed standing up from a chair using your arms (e.g., wheelchair or bedside chair)?: Total Help needed to walk in hospital room?: Total Help needed climbing 3-5 steps with a railing? : Total 6 Click Score: 6    End of Session Equipment Utilized During Treatment: Oxygen Activity Tolerance: Patient limited by fatigue Patient left: in bed;with call bell/phone within reach;with bed alarm set Nurse Communication: Mobility  status;Need for lift equipment PT Visit Diagnosis: Muscle weakness (generalized) (M62.81);Pain Pain - part of body: (generalized)     Time: 4098-1191 PT Time Calculation (min) (ACUTE ONLY): 33 min  Charges:  $Therapeutic Activity: 23-37 mins                     [facility] PT Acute Rehabilitation Services Pager [phone] Office [phone]    Angelina Ok [facility] 11/30/2018, 10:58 AM

## 2018-11-30 NOTE — Care Management Important Message (Signed)
Important Message  Patient Details  Name: Marcia Saunders MRN: [id number] Date of Birth: Nov 24, 1930   Medicare Important Message Given:  Yes     Shelda Altes 11/30/2018, 4:07 PM

## 2018-11-30 NOTE — Progress Notes (Signed)
[facility] Progress Note  Name: Marcia Saunders MRN: [id number] DOB: August 31, 1930  Subjective:  She had 2.4 liters UOP over 10/7.  ABG with CO2 retention.  She was on BIPAP overnight per nursing and was more alert but has been transitioned to 3 liters oxygen.  More somnolent on exam this morning and answers limited questions.  Review of systems:  Limited secondary to patient factors - somnolence; denies overt shortness of breath or n/v  ---------------- Background on consult:  HPI: Marcia Saunders is a 85 y.o. female with no prior medical history on file prior to admission. Per daughter, patient has been treated in past for hypertension but has not been on medications in years. Also on file is a cardiac cath from 2009 which showed non-obstructive CAD.   Patient presented to ED 11/22/2018 S/P cardiac arrest in setting of acute blood loss from presumed LGIB. HGB was 4.0 on admission. SCr 1.14 BUN EGFR 50 on arrival to ED. Since admission renal function has gradually declined and she has now developed AKI with SCr 3.64 EGFR down to 12. She is S/P 2 units of PRBCS, off pressors.  GI and cardiology have been consulted.  Echo done 11/23/2018 revealed EF 60-65% with moderate LVH. R atrial size  RV with mildly reduced systolic function. Severely dilated R Atrial. She has been diuresed on furosemide, however still with generalized anasarca.  Patient lives at home with daughter, is currently on no medications.    Intake/Output Summary (Last 24 hours) at 11/30/2018 1228 Last data filed at 11/30/2018 0900 Gross per 24 hour  Intake 417 ml  Output 1950 ml  Net -1533 ml    Vitals:  Vitals:   11/30/18 0800 11/30/18 0915 11/30/18 1006 11/30/18 1100  BP: (!) 141/72  (!) 158/85 (!) 157/79  Pulse: 68  67 72  Resp: 13   19  Temp: (!) 97.5 F (36.4 C)   97.7 F (36.5 C)  TempSrc: Axillary   Axillary  SpO2: 100% 98%  92%  Weight:      Height:         Physical Exam:  General elderly  female in bed in no acute distress HEENT normocephalic atraumatic  Lungs clear but reduced anteriorly; on nasal cannula; normal work of breathing at rest Heart regular rate and rhythm no rubs  Abdomen soft nontender nondistended Extremities 2+ edema lower extremities Neuro - oriented to person, not year or location  Medications reviewed   Labs:  BMP Latest Ref Rng & Units 11/30/2018 11/29/2018 11/28/2018  Glucose 70 - 99 mg/dL 114(H) 102(H) 104(H)  BUN 8 - 23 mg/dL 83(H) 81(H) 75(H)  Creatinine 0.44 - 1.00 mg/dL 3.48(H) 3.64(H) 3.49(H)  Sodium 135 - 145 mmol/L 142 138 138  Potassium 3.5 - 5.1 mmol/L 3.6 4.2 4.4  Chloride 98 - 111 mmol/L 100 100 101  CO2 22 - 32 mmol/L '27 24 25  ' Calcium 8.9 - 10.3 mg/dL 8.9 8.5(L) 8.3(L)     Assessment/Plan:   # AKI  - Secondary to ischemic and pre-renal insults in setting of arrest and hypovolemic shock related to GI bleed now with overload and ongoing diuresis efforts - hopeful for plateau   - no acute indication for dialysis though monitoring for need; with right heart failure concern that she might not tolerate HD well   # Acute right heart failure  - with anasarca  - s/p metolazone on 10/7 once  - redose metolazone before next lasix today  - Currently ordered  for lasix 80 mg IV BID   # Somnolence - Restarting BIPAP - per RN was alert and oriented earlier after BIPAP  - BUN elevated but stable   # Acute hypoxic and hypercarbic respiratory failure  - Nursing has called resp to put back on BIPAP  - Continue to diurese  # CKD III  - mild with baseline Cr 1   # Anemia acute blood loss - improved; presented with Hb 4.0 and is s/p PRBC's  # HTN  - Continue current regimen; diuresing   Claudia Desanctis, MD 11/30/2018 12:28 PM

## 2018-11-30 NOTE — Progress Notes (Signed)
RT switched patient from dream station bipap to V60 to help adjust and monitor patient's tidal volumes and rates.  Tolerating bipap well at this time.  Will continue to monitor.

## 2018-12-01 ENCOUNTER — Other Ambulatory Visit: Payer: Self-pay

## 2018-12-01 ENCOUNTER — Encounter (HOSPITAL_COMMUNITY): Payer: Self-pay

## 2018-12-01 ENCOUNTER — Inpatient Hospital Stay (HOSPITAL_COMMUNITY): Payer: Medicare Other

## 2018-12-01 DIAGNOSIS — Z515 Encounter for palliative care: Secondary | ICD-10-CM

## 2018-12-01 DIAGNOSIS — N170 Acute kidney failure with tubular necrosis: Secondary | ICD-10-CM

## 2018-12-01 DIAGNOSIS — I469 Cardiac arrest, cause unspecified: Secondary | ICD-10-CM

## 2018-12-01 DIAGNOSIS — I5031 Acute diastolic (congestive) heart failure: Secondary | ICD-10-CM

## 2018-12-01 DIAGNOSIS — D649 Anemia, unspecified: Secondary | ICD-10-CM

## 2018-12-01 DIAGNOSIS — J9602 Acute respiratory failure with hypercapnia: Secondary | ICD-10-CM

## 2018-12-01 LAB — RENAL FUNCTION PANEL
Albumin: 2 g/dL — ABNORMAL LOW (ref 3.5–5.0)
Anion gap: 16 — ABNORMAL HIGH (ref 5–15)
BUN: 82 mg/dL — ABNORMAL HIGH (ref 8–23)
CO2: 26 mmol/L (ref 22–32)
Calcium: 9.2 mg/dL (ref 8.9–10.3)
Chloride: 102 mmol/L (ref 98–111)
Creatinine, Ser: 3.22 mg/dL — ABNORMAL HIGH (ref 0.44–1.00)
GFR calc Af Amer: 14 mL/min — ABNORMAL LOW (ref >60)
GFR calc non Af Amer: 12 mL/min — ABNORMAL LOW (ref >60)
Glucose, Bld: 75 mg/dL (ref 70–99)
Phosphorus: 5.6 mg/dL — ABNORMAL HIGH (ref 2.5–4.6)
Potassium: 3.2 mmol/L — ABNORMAL LOW (ref 3.5–5.1)
Sodium: 144 mmol/L (ref 135–145)

## 2018-12-01 LAB — GLUCOSE, CAPILLARY
Glucose-Capillary: 59 mg/dL — ABNORMAL LOW (ref 70–99)
Glucose-Capillary: 76 mg/dL (ref 70–99)
Glucose-Capillary: 72 mg/dL (ref 70–99)
Glucose-Capillary: 86 mg/dL (ref 70–99)
Glucose-Capillary: 103 mg/dL — ABNORMAL HIGH (ref 70–99)

## 2018-12-01 LAB — CBC
HCT: 34.3 % — ABNORMAL LOW (ref 36.0–46.0)
Hemoglobin: 10.8 g/dL — ABNORMAL LOW (ref 12.0–15.0)
MCH: 28.1 pg (ref 26.0–34.0)
MCHC: 31.5 g/dL (ref 30.0–36.0)
MCV: 89.1 fL (ref 80.0–100.0)
Platelets: 209 10*3/uL (ref 150–400)
RBC: 3.85 MIL/uL — ABNORMAL LOW (ref 3.87–5.11)
RDW: 19.6 % — ABNORMAL HIGH (ref 11.5–15.5)
WBC: 8.7 10*3/uL (ref 4.0–10.5)
nRBC: 1.4 % — ABNORMAL HIGH (ref 0.0–0.2)

## 2018-12-01 MED ORDER — FUROSEMIDE 10 MG/ML IJ SOLN: 80.0000 mg | Freq: Two times a day (BID) | INTRAMUSCULAR | 0 refills | Status: AC

## 2018-12-01 MED ORDER — IPRATROPIUM BROMIDE 0.02 % IN SOLN: 0.5000 mg | Freq: Three times a day (TID) | RESPIRATORY_TRACT | 12 refills | Status: AC

## 2018-12-01 MED ORDER — ROSUVASTATIN CALCIUM 10 MG PO TABS: 10.0000 mg | ORAL_TABLET | Freq: Every day | ORAL | Status: AC

## 2018-12-01 MED ORDER — DEXTROSE 50 % IV SOLN
INTRAVENOUS | Status: AC
  Administered 2018-12-01: 50 mL
  Filled 2018-12-01: qty 50

## 2018-12-01 MED ORDER — LEVALBUTEROL HCL 0.63 MG/3ML IN NEBU: 0.6300 mg | INHALATION_SOLUTION | Freq: Three times a day (TID) | RESPIRATORY_TRACT | 12 refills | Status: AC

## 2018-12-01 MED ORDER — PANTOPRAZOLE SODIUM 40 MG IV SOLR: 40.0000 mg | Freq: Two times a day (BID) | INTRAVENOUS | Status: AC

## 2018-12-01 MED ORDER — POTASSIUM CHLORIDE CRYS ER 20 MEQ PO TBCR
40.0000 meq | EXTENDED_RELEASE_TABLET | Freq: Once | ORAL | Status: AC
  Administered 2018-12-01: 40 meq via ORAL
  Filled 2018-12-01: qty 2

## 2018-12-01 MED ORDER — FUROSEMIDE 10 MG/ML IJ SOLN
80.0000 mg | Freq: Two times a day (BID) | INTRAMUSCULAR | Status: DC
  Administered 2018-12-01 (×2): 80 mg via INTRAVENOUS
  Filled 2018-12-01 (×2): qty 8

## 2018-12-01 MED ORDER — METOPROLOL TARTRATE 25 MG PO TABS: 12.5000 mg | ORAL_TABLET | Freq: Two times a day (BID) | ORAL | Status: AC

## 2018-12-01 NOTE — Progress Notes (Signed)
Subjective:  1875 of UOP - crt from 3.6 to 3.2 over last 2 days- seems pleasantly confused.  Has a bed at [facility] today ?    Objective Vital signs in last 24 hours: Vitals:   12/01/18 1000 12/01/18 1200 12/01/18 1251 12/01/18 1300  BP: (!) 152/73 135/82  140/80  Pulse:      Resp: 16 17    Temp:    98.6 F (37 C)  TempSrc:    Axillary  SpO2: 95% 98% 96%   Weight:      Height:       Weight change: -8.533 kg  Intake/Output Summary (Last 24 hours) at 12/01/2018 1341 Last data filed at 12/01/2018 1200 Gross per 24 hour  Intake 399 ml  Output 1875 ml  Net -1476 ml    Assessment/ Plan: Pt is a 85 y.o. yo female with right sided heart failure/Afib who was admitted on 11/22/2018 with hemorrhagic shock/cardiac arrest with resultant AKI Assessment/Plan: 1. Renal-  AKI in the setting of hemorrhagic shock/arrest.  Thankfully it appears that renal function has plateaued and is trending down.   Given her age and right sided heart failure, possible dementia would not consider her a dialysis candidate.  Hope kidney function will improve back to her baseline - was 1.1 on admission on 9/30 2.HTN/volume-   Diuresing reasonably well on lasix 80 IV q 12 hours, will be OK to continue at [facility]- even though she has lots of volume would caution at being too aggressive with diuretics  3. Anemia- s/p many transfusions, last on 10/6--hgb now [place]    Labs: Basic Metabolic Panel: Recent Labs  Lab 11/27/18 0546  11/29/18 0550 11/30/18 0513 12/01/18 0402  NA 136   < > 138 142 144  K 4.7   < > 4.2 3.6 3.2*  CL 99   < > 100 100 102  CO2 25   < > 24 27 26   GLUCOSE 107*   < > 102* 114* 75  BUN 68*   < > 81* 83* 82*  CREATININE 3.18*   < > 3.64* 3.48* 3.22*  CALCIUM 8.1*   < > 8.5* 8.9 9.2  PHOS 5.7*  --   --  5.9* 5.6*   < > = values in this interval not displayed.   Liver Function Tests: Recent Labs  Lab 11/30/18 0513 11/30/18 1823 12/01/18 0402  AST  --  28  --   ALT   --  46*  --   ALKPHOS  --  86  --   BILITOT  --  1.4*  --   PROT  --  5.0*  --   ALBUMIN 2.0* 1.9* 2.0*   No results for input(s): LIPASE, AMYLASE in the last 168 hours. No results for input(s): AMMONIA in the last 168 hours. CBC: Recent Labs  Lab 11/27/18 0546 11/27/18 2253 11/28/18 0630 11/29/18 0550 11/30/18 0513 12/01/18 0402  WBC 9.5 8.4 8.5  --  8.9 8.7  HGB 7.8* 7.9* 7.5* 10.6* 10.4* 10.8*  HCT 24.5* 24.1* 24.7* 32.1* 32.2* 34.3*  MCV 86.0 86.7 88.2  --  88.7 89.1  PLT 131* 142* 156  --  191 209   Cardiac Enzymes: No results for input(s): CKTOTAL, CKMB, CKMBINDEX, TROPONINI in the last 168 hours. CBG: Recent Labs  Lab 11/30/18 2348 12/01/18 0424 12/01/18 0503 12/01/18 0813 12/01/18 1243  GLUCAP 71 59* 76 72 86    Iron Studies: No results for input(s): IRON, TIBC, TRANSFERRIN,  FERRITIN in the last 72 hours. Studies/Results: Ct Head Wo Contrast  Result Date: 12/01/2018 CLINICAL DATA:  Encephalopathy EXAM: CT HEAD WITHOUT CONTRAST TECHNIQUE: Contiguous axial images were obtained from the base of the skull through the vertex without intravenous contrast. COMPARISON:  None. FINDINGS: Brain: There is no mass, hemorrhage or extra-axial collection. There is generalized atrophy without lobar predilection. Hypodensity of the white matter is most commonly associated with chronic microvascular disease. Vascular: No abnormal hyperdensity of the major intracranial arteries or dural venous sinuses. No intracranial atherosclerosis. Skull: The visualized skull base, calvarium and extracranial soft tissues are normal. Sinuses/Orbits: No fluid levels or advanced mucosal thickening of the visualized paranasal sinuses. No mastoid or middle ear effusion. The orbits are normal. IMPRESSION: Generalized atrophy and chronic microvascular ischemia without acute intracranial abnormality. Electronically Signed   By: Deatra Robinson M.D.   On: 12/01/2018 03:10   Dg Chest Port 1 View  Result Date:  11/30/2018 CLINICAL DATA:  Shortness of breath. EXAM: PORTABLE CHEST 1 VIEW COMPARISON:  11/28/2018 FINDINGS: Stable left jugular catheter and enlarged cardiac silhouette. Interval mild left basilar atelectasis. Improved aeration of the right lung. Stable small left pleural effusion. No acute bony abnormality. IMPRESSION: 1. Stable cardiomegaly and small left pleural effusion. 2. Mild left basilar atelectasis. 3. Improved aeration of the right lung. Electronically Signed   By: Beckie Salts M.D.   On: 11/30/2018 19:38   Medications: Infusions:   Scheduled Medications: . Chlorhexidine Gluconate Cloth  6 each Topical Daily  . feeding supplement  1 Container Oral TID BM  . furosemide  80 mg Intravenous BID  . ipratropium  0.5 mg Nebulization TID  . levalbuterol  0.63 mg Nebulization TID  . lidocaine  1 patch Transdermal Q24H  . mouth rinse  15 mL Mouth Rinse BID  . metolazone  5 mg Oral Once  . metoprolol tartrate  12.5 mg Oral BID  . pantoprazole (PROTONIX) IV  40 mg Intravenous Q12H  . rosuvastatin  10 mg Oral q1800    have reviewed scheduled and prn medications.  Physical Exam: General: NAD- pleasant dementia Heart: RRR Lungs: dec BS at bases Abdomen: obese, soft, non tender Extremities: pitting edema throughout     12/01/2018,1:41 PM  LOS: 9 days

## 2018-12-01 NOTE — Progress Notes (Signed)
Pt transported to CT and back to 6e06 on BIPAP without incidence.

## 2018-12-01 NOTE — Discharge Summary (Signed)
Physician Discharge Summary  Marcia Saunders ZOX:[id number] DOB: 11/28/1930 DOA: 11/22/2018  PCP: Patient, No Pcp Per  Admit date: 11/22/2018 Discharge date: 12/01/2018  Admitted From: Home. Disposition: [facility]  Recommendations for Outpatient Follow-up:  1. Follow up with cardiology, Nephrology and palliative care at [facility].  2. Follow up labs every day.    Discharge Condition:guarded.  CODE STATUS:PARTIAL Diet recommendation: Heart Healthy   Brief/Interim Summary: 85 year old lady with morbid obesity, Hypertension, CAD   admitted for  hemorrhagic shock from GI bleed sec to severe anemia, NSTEMI, and anasarca due to heart failure and AKI. GI, Cardiology and nephrology on board.  She was admitted initially by PCCM, intubated on admission and she self extubated on 11/24/2018. [facility]  She received 4 units of prbc transfusion on admission along with 2 units of FFP and briefly required IV pressors.   She is currently off pressors on telemetry bed.   Discharge Diagnoses:  Active Problems:   Hemorrhagic shock (HCC)   Lower GI bleed   Cardiac arrest (HCC)   Coagulopathy (HCC)   Acute respiratory failure with hypoxia and hypercarbia (HCC)   Acute blood loss anemia   Possible obstructive sleep apnea   bilateral pleural effusions   Paroxysmal atrial fibrillation (HCC)   Acute diastolic CHF (congestive heart failure) (HCC)   AKI (acute kidney injury) (HCC)   Anasarca Consultants:   nephrology  Cardiology.   Palliative care.   Gastroenterology.    Procedures:  ETT on 9/30 and extubated on 11/24/2018 A line 11/22/2018.  CT abdomen and pelvis.  Echocardiogram   Antimicrobials: one dose of rocephin.   Status post cardiac arrest in the setting of suspected hemorrhagic shock due to lower GI bleed/acute blood loss anemia: She status post 4 units of packed red blood cells so she presented with a hemoglobin of 4. GI was consulted who  recommended no further intervention. She will need to be on iron sulfate as an outpatient.  She received IV Feraheme in house. We will consult hospice and palliative care, they are on board and [facility].   Acute hypoxic and hypercarbic respiratory failure possibly due to underlying obstructive sleep apnea, pulmonary edema and bilateral pleural effusion: New acute respiratory failure with hypercarbia,  she has obstructive sleep apnea for which she uses BiPAP at night she was placed on BiPAP today for lethargy. ABG shows hypercapnia.   Paroxysmal atrial fibrillation: Rate control with metoprolol.  Continue current regimen appreciate cardiology's assistance, she was not a candidate for anticoagulation due to her recent GI bleed.  Elevated cardiac biomarkers: In the setting of a drop in hemoglobin and cardiac arrest likely demand ischemia 2D echo showed no wall motion abnormality, cardiology was consulted and recommended no further ischemic work-up. Avoid aspirin due to recent GI bleed continue Crestor and metoprolol.  Right-sided heart failure/anasarca: diuresis as per nephrology.   AKI/Possible ATN: On admission her creatinine was 1.1. Probably sec to hemorraghic shock and ATN from severe anemia.  Nephrology was consulted, they recommended to continue with IV Lasix  his creatinine has plateaued. caution with IV lasix and not a candidate for HD due to her age, co morbidities and dementia.  Discussed the above with the patient's daughter.   Acute metabolic encephalopathy: probably from hypercapnia.  She has remained afebrile with no leukocytosis and ABG was done overnight that showed showed hypercapnia.    She has not received any medications which will sedate  her and she is not hypothermic or hypothermic, and there is no history of drugs. CXR showed improved aeration and CT head without contrast is negative for acute intracranial pathology.  She is able to answer  simple questions and move all extremities.    Discharge Instructions  Discharge Instructions    Diet - low sodium heart healthy   Complete by: As directed    Discharge instructions   Complete by: As directed    Please follow up with palliative care, cardiology and nephrology at [facility].     Allergies as of 12/01/2018   No Known Allergies     Medication List    TAKE these medications   furosemide 10 MG/ML injection Commonly known as: LASIX Inject 8 mLs (80 mg total) into the vein 2 (two) times daily.   ipratropium 0.02 % nebulizer solution Commonly known as: ATROVENT Take 2.5 mLs (0.5 mg total) by nebulization 3 (three) times daily.   levalbuterol 0.63 MG/3ML nebulizer solution Commonly known as: XOPENEX Take 3 mLs (0.63 mg total) by nebulization 3 (three) times daily.   metoprolol tartrate 25 MG tablet Commonly known as: LOPRESSOR Take 0.5 tablets (12.5 mg total) by mouth 2 (two) times daily.   pantoprazole 40 MG injection Commonly known as: PROTONIX Inject 40 mg into the vein every 12 (twelve) hours.   rosuvastatin 10 MG tablet Commonly known as: CRESTOR Take 1 tablet (10 mg total) by mouth daily at 6 PM.      Contact information for after-discharge care    Destination    [facility] Preferred SNF .   Service: Skilled Nursing Contact information: [address] [place] [address] [phone]             No Known Allergies      Procedures/Studies: Ct Abdomen Pelvis Wo Contrast  Result Date: 11/23/2018 CLINICAL DATA:  Abdominal distension, history of rectal bleeding and hypotension. EXAM: CT ABDOMEN AND PELVIS WITHOUT CONTRAST TECHNIQUE: Multidetector CT imaging of the abdomen and pelvis was performed following the standard protocol without IV contrast. COMPARISON:  None. FINDINGS: Lower chest: Dense basilar airspace disease and small bilateral pleural effusions. Central venous access device terminates at the caval to  atrial junction. Heart size markedly enlarged. Hepatobiliary: Low-density area along the anterior hepatic margin in the intralobar fissure likely represents a cyst. Assessment is markedly limited however given lack of intravenous contrast and secondary to patient body habitus and arm position. Sludge is seen within the gallbladder. No overt biliary ductal dilation. Pancreas: Unremarkable. No pancreatic ductal dilatation or surrounding inflammatory changes. Spleen: Limited assessment of the spleen secondary to beam hardening artifact from arm position. Adrenals/Urinary Tract: Adrenal glands are normal. No signs of hydronephrosis.  Renal contours are normal. Stomach/Bowel: Enteric tube in place within the stomach. No signs of acute gastrointestinal process. The appendix is not visualized, no secondary signs to suggest appendicitis. No signs of pneumatosis or bowel obstruction. Vascular/Lymphatic: Moderate calcific atherosclerosis. Vessels not well assessed without contrast. No signs of retroperitoneal adenopathy. No signs of pelvic adenopathy. Reproductive: Post hysterectomy. Other: Diffuse intra-abdominal ascites. Density measurements of fluid are hampered by technical factors secondary to body habitus and noise on the current exam. Extensive body wall edema. Musculoskeletal: Spinal degenerative change without acute or destructive bone process. IMPRESSION: 1. Anasarca. 2. Area of low attenuation along the anterior liver margin may represent a cyst or loculated fluid, this is not clear on the current exam which is markedly limited by body habitus and technical factors.  3. Basilar airspace disease may represent pneumonia or dependent consolidation associated with effusions. 4. Marked cardiomegaly. Electronically Signed   By: Donzetta Kohut M.D.   On: 11/23/2018 15:12   Ct Head Wo Contrast  Result Date: 12/01/2018 CLINICAL DATA:  Encephalopathy EXAM: CT HEAD WITHOUT CONTRAST TECHNIQUE: Contiguous axial images were  obtained from the base of the skull through the vertex without intravenous contrast. COMPARISON:  None. FINDINGS: Brain: There is no mass, hemorrhage or extra-axial collection. There is generalized atrophy without lobar predilection. Hypodensity of the white matter is most commonly associated with chronic microvascular disease. Vascular: No abnormal hyperdensity of the major intracranial arteries or dural venous sinuses. No intracranial atherosclerosis. Skull: The visualized skull base, calvarium and extracranial soft tissues are normal. Sinuses/Orbits: No fluid levels or advanced mucosal thickening of the visualized paranasal sinuses. No mastoid or middle ear effusion. The orbits are normal. IMPRESSION: Generalized atrophy and chronic microvascular ischemia without acute intracranial abnormality. Electronically Signed   By: Deatra Robinson M.D.   On: 12/01/2018 03:10   Dg Chest Port 1 View  Result Date: 11/30/2018 CLINICAL DATA:  Shortness of breath. EXAM: PORTABLE CHEST 1 VIEW COMPARISON:  11/28/2018 FINDINGS: Stable left jugular catheter and enlarged cardiac silhouette. Interval mild left basilar atelectasis. Improved aeration of the right lung. Stable small left pleural effusion. No acute bony abnormality. IMPRESSION: 1. Stable cardiomegaly and small left pleural effusion. 2. Mild left basilar atelectasis. 3. Improved aeration of the right lung. Electronically Signed   By: Beckie Salts M.D.   On: 11/30/2018 19:38   Dg Chest Port 1 View  Result Date: 11/28/2018 CLINICAL DATA:  Hypoxia. EXAM: PORTABLE CHEST 1 VIEW COMPARISON:  Radiographs 11/24/2018 and 11/23/2018. FINDINGS: 1356 hours. Patient remains rotated to the left. The endotracheal tube and enteric tube have been removed in the interval. The left IJ central venous catheter is unchanged, projecting to the mid SVC level. There is stable cardiomegaly with increasing pulmonary edema and bilateral pleural effusions. No evidence of pneumothorax. The bones  appear unchanged. Telemetry leads overlie the chest. IMPRESSION: 1. Worsened pulmonary edema and pleural effusions consistent with congestive heart failure. 2. Stable cardiomegaly. Electronically Signed   By: Carey Bullocks M.D.   On: 11/28/2018 15:47   Dg Chest Port 1 View  Result Date: 11/24/2018 CLINICAL DATA:  Acute respiratory failure, hypoxia, intubation EXAM: PORTABLE CHEST 1 VIEW COMPARISON:  Portable exam 0806 hours compared to 11/23/2018 FINDINGS: Rotated to the LEFT. Tip of endotracheal tube projects 3.5 cm above carina. LEFT jugular line with tip projecting over SVC. Nasogastric tube extends into stomach. Enlargement of cardiac silhouette with vascular congestion. BILATERAL perihilar infiltrates question pulmonary edema. Bibasilar effusions and atelectasis. Low lung volumes. No pneumothorax or acute osseous findings. IMPRESSION: Enlargement of cardiac silhouette with pulmonary vascular congestion and mild diffuse perihilar infiltrates favoring pulmonary edema. Bibasilar pleural effusions and atelectasis. Electronically Signed   By: Ulyses Southward M.D.   On: 11/24/2018 08:37   Dg Chest Port 1 View  Result Date: 11/23/2018 CLINICAL DATA:  Respiratory failure. EXAM: PORTABLE CHEST 1 VIEW COMPARISON:  Radiographs of November 22, 2018. FINDINGS: Stable cardiomegaly. Endotracheal and nasogastric tubes are in grossly good position. Left internal jugular catheter is unchanged in position. No pneumothorax is noted. Mild left pleural effusion is noted with associated atelectasis or infiltrate. Minimal right basilar subsegmental atelectasis is noted. Bony thorax is unremarkable. IMPRESSION: Support apparatus in grossly good position. Stable left basilar opacity as described above. Stable minimal right basilar opacity as well.  Electronically Signed   By: Lupita Raider M.D.   On: 11/23/2018 08:52   Dg Chest Port 1 View  Result Date: 11/22/2018 CLINICAL DATA:  85 year old female with central line  placement. EXAM: PORTABLE CHEST 1 VIEW COMPARISON:  Earlier radiograph dated 11/22/2018 FINDINGS: Interval placement of a left IJ central venous line with tip at the cavoatrial junction. No pneumothorax. Endotracheal tube with tip at the level of the carina tilting towards the right mainstem bronchus. Recommend retraction by approximately 3 cm for optimal positioning. Enteric tube extends below the diaphragm with tip beyond the inferior margin of the image. No interval change in the appearance of the lungs or cardiomediastinal silhouette since the earlier radiograph. IMPRESSION: 1. Left IJ central venous line with tip at the cavoatrial junction. No pneumothorax. 2. Endotracheal tube with tip at the level of the carina tilting towards the right mainstem bronchus. Recommend retraction by 3 cm for optimal positioning. Electronically Signed   By: Elgie Collard M.D.   On: 11/22/2018 21:22   Dg Chest Portable 1 View  Result Date: 11/22/2018 CLINICAL DATA:  Acute cardiac arrest. Intubation. EXAM: PORTABLE CHEST 1 VIEW COMPARISON:  None. FINDINGS: Patient is rotated to the left. Endotracheal tube and nasogastric tube are in appropriate position. Low lung volumes are noted. Atelectasis or infiltrate is seen in the left retrocardiac lung base. Right lung remains clear. No pneumothorax visualized. IMPRESSION: Low lung volumes, with atelectasis or infiltrate in left retrocardiac lung base. Endotracheal tube and nasogastric tube in appropriate position. Electronically Signed   By: Danae Orleans M.D.   On: 11/22/2018 17:01       Subjective: Awake but slightly lethargic.   Discharge Exam: Vitals:   12/01/18 1345 12/01/18 1400  BP:  139/77  Pulse: 94 91  Resp: (!) 24 17  Temp:    SpO2: 94% 94%   Vitals:   12/01/18 1251 12/01/18 1300 12/01/18 1345 12/01/18 1400  BP:  140/80  139/77  Pulse:  66 94 91  Resp:  18 (!) 24 17  Temp:  98.6 F (37 C)    TempSrc:  Axillary    SpO2: 96% 95% 94% 94%  Weight:       Height:        General: Pt is  Slightly lethargic , but opening her eyes and answering simple questions.  Cardiovascular: IRREGULARly irregular,  Respiratory: CTA bilaterally, no wheezing, no rhonchi Abdominal: Soft, NT, ND, bowel sounds +     The results of significant diagnostics from this hospitalization (including imaging, microbiology, ancillary and laboratory) are listed below for reference.     Microbiology: Recent Results (from the past 240 hour(s))  Culture, blood (Routine X 2) w Reflex to ID Panel     Status: None   Collection Time: 11/22/18  4:40 PM   Specimen: BLOOD  Result Value Ref Range Status   Specimen Description BLOOD BLOOD LEFT FOREARM  Final   Special Requests   Final    BOTTLES DRAWN AEROBIC AND ANAEROBIC Blood Culture adequate volume   Culture   Final    NO GROWTH 5 DAYS Performed at [facility], [address] [place], [place] [address]    Report Status 11/27/2018 FINAL  Final  Culture, blood (Routine X 2) w Reflex to ID Panel     Status: None   Collection Time: 11/22/18  4:40 PM   Specimen: BLOOD  Result Value Ref Range Status   Specimen Description BLOOD LEFT ANTECUBITAL  Final  Special Requests   Final    BOTTLES DRAWN AEROBIC AND ANAEROBIC Blood Culture adequate volume   Culture   Final    NO GROWTH 5 DAYS Performed at [facility], [address] [place], [place] [address]    Report Status 11/27/2018 FINAL  Final  SARS Coronavirus 2 [facility](Hospital order, Performed in [facility] hospital lab) Nasopharyngeal Nasopharyngeal Swab     Status: None   Collection Time: 11/22/18  4:52 PM   Specimen: Nasopharyngeal Swab  Result Value Ref Range Status   SARS Coronavirus 2 NEGATIVE NEGATIVE Final    Comment: (NOTE) If result is NEGATIVE SARS-CoV-2 target nucleic acids are NOT DETECTED. The SARS-CoV-2 RNA is generally detectable in upper and lower  respiratory specimens during the acute phase of infection. The lowest  concentration of  SARS-CoV-2 viral copies this assay can detect is 250  copies / mL. A negative result does not preclude SARS-CoV-2 infection  and should not be used as the sole basis for treatment or other  patient management decisions.  A negative result may occur with  improper specimen collection / handling, submission of specimen other  than nasopharyngeal swab, presence of viral mutation(s) within the  areas targeted by this assay, and inadequate number of viral copies  (<250 copies / mL). A negative result must be combined with clinical  observations, patient history, and epidemiological information. If result is POSITIVE SARS-CoV-2 target nucleic acids are DETECTED. The SARS-CoV-2 RNA is generally detectable in upper and lower  respiratory specimens dur ing the acute phase of infection.  Positive  results are indicative of active infection with SARS-CoV-2.  Clinical  correlation with patient history and other diagnostic information is  necessary to determine patient infection status.  Positive results do  not rule out bacterial infection or co-infection with other viruses. If result is PRESUMPTIVE POSTIVE SARS-CoV-2 nucleic acids MAY BE PRESENT.   A presumptive positive result was obtained on the submitted specimen  and confirmed on repeat testing.  While 2019 novel coronavirus  (SARS-CoV-2) nucleic acids may be present in the submitted sample  additional confirmatory testing may be necessary for epidemiological  and / or clinical management purposes  to differentiate between  SARS-CoV-2 and other Sarbecovirus currently known to infect humans.  If clinically indicated additional testing with an alternate test  methodology [phone]([id number]) is advised. The SARS-CoV-2 RNA is generally  detectable in upper and lower respiratory sp ecimens during the acute  phase of infection. The expected result is Negative. Fact Sheet for Patients:  BoilerBrush.com.cyhttps://www.fda.gov/media/136312/download Fact Sheet for Healthcare  Providers: https://pope.com/https://www.fda.gov/media/136313/download This test is not yet approved or cleared by the [place] FDA and has been authorized for detection and/or diagnosis of SARS-CoV-2 by FDA under an Emergency Use Authorization (EUA).  This EUA will remain in effect (meaning this test can be used) for the duration of the COVID-19 declaration under Section 564(b)(1) of the Act, 21 U.S.C. section 360bbb-3(b)(1), unless the authorization is terminated or revoked sooner. Performed at [facility], [address] [place], [place] [address]   MRSA PCR Screening     Status: None   Collection Time: 11/22/18  9:59 PM   Specimen: Nasal Mucosa; Nasopharyngeal  Result Value Ref Range Status   MRSA by PCR NEGATIVE NEGATIVE Final    Comment:        The GeneXpert MRSA Assay (FDA approved for NASAL specimens only), is one component of a comprehensive MRSA colonization surveillance program. It is not intended to  diagnose MRSA infection nor to guide or monitor treatment for MRSA infections. Performed at [facility], [address] [place], [place] [address]   Culture, respiratory (non-expectorated)     Status: None   Collection Time: 11/24/18  8:27 AM   Specimen: Tracheal Aspirate; Respiratory  Result Value Ref Range Status   Specimen Description TRACHEAL ASPIRATE  Final   Special Requests NONE  Final   Gram Stain   Final    RARE WBC PRESENT, PREDOMINANTLY MONONUCLEAR NO ORGANISMS SEEN Performed at [facility], [address] [place], [place] [address]    Culture RARE CANDIDA ALBICANS  Final   Report Status 11/27/2018 FINAL  Final     Labs: BNP (last 3 results) Recent Labs    11/22/18 1640  BNP 739.2*   Basic Metabolic Panel: Recent Labs  Lab 11/24/18 1518 11/25/18 0307  11/25/18 1823  11/27/18 0546 11/28/18 0630 11/29/18 0550 11/30/18 0513 12/01/18 0402  NA 133* 134*   < > 132*   < > 136 138 138 142 144  K 3.9 3.8   < > 3.6   < > 4.7 4.4 4.2  3.6 3.2*  CL 99 100  --  97*   < > 99 101 100 100 102  CO2 24 25  --  24   < > 25 25 24 27 26   GLUCOSE 81 75  --  107*   < > 107* 104* 102* 114* 75  BUN 51* 54*  --  57*   < > 68* 75* 81* 83* 82*  CREATININE 1.86* 2.48*  --  2.61*   < > 3.18* 3.49* 3.64* 3.48* 3.22*  CALCIUM 7.5* 7.8*  --  7.7*   < > 8.1* 8.3* 8.5* 8.9 9.2  MG 1.8 1.8  --  1.9  --  2.0  --   --   --   --   PHOS 4.5 4.9*  --  5.0*  --  5.7*  --   --  5.9* 5.6*   < > = values in this interval not displayed.   Liver Function Tests: Recent Labs  Lab 11/30/18 0513 11/30/18 1823 12/01/18 0402  AST  --  28  --   ALT  --  46*  --   ALKPHOS  --  86  --   BILITOT  --  1.4*  --   PROT  --  5.0*  --   ALBUMIN 2.0* 1.9* 2.0*   No results for input(s): LIPASE, AMYLASE in the last 168 hours. No results for input(s): AMMONIA in the last 168 hours. CBC: Recent Labs  Lab 11/27/18 0546 11/27/18 2253 11/28/18 0630 11/29/18 0550 11/30/18 0513 12/01/18 0402  WBC 9.5 8.4 8.5  --  8.9 8.7  HGB 7.8* 7.9* 7.5* 10.6* 10.4* 10.8*  HCT 24.5* 24.1* 24.7* 32.1* 32.2* 34.3*  MCV 86.0 86.7 88.2  --  88.7 89.1  PLT 131* 142* 156  --  191 209   Cardiac Enzymes: No results for input(s): CKTOTAL, CKMB, CKMBINDEX, TROPONINI in the last 168 hours. BNP: Invalid input(s): POCBNP CBG: Recent Labs  Lab 11/30/18 2348 12/01/18 0424 12/01/18 0503 12/01/18 0813 12/01/18 1243  GLUCAP 71 59* 76 72 86   D-Dimer No results for input(s): DDIMER in the last 72 hours. Hgb A1c No results for input(s): HGBA1C in the last 72 hours. Lipid Profile No results for input(s): CHOL, HDL, LDLCALC, TRIG, CHOLHDL, LDLDIRECT in the last 72 hours. Thyroid function studies No results  for input(s): TSH, T4TOTAL, T3FREE, THYROIDAB in the last 72 hours.  Invalid input(s): FREET3 Anemia work up No results for input(s): VITAMINB12, FOLATE, FERRITIN, TIBC, IRON, RETICCTPCT in the last 72 hours. Urinalysis    Component Value Date/Time   COLORURINE YELLOW  11/29/2018 1600   APPEARANCEUR CLOUDY (A) 11/29/2018 1600   LABSPEC 1.009 11/29/2018 1600   PHURINE 5.0 11/29/2018 1600   GLUCOSEU NEGATIVE 11/29/2018 1600   HGBUR SMALL (A) 11/29/2018 1600   BILIRUBINUR NEGATIVE 11/29/2018 1600   KETONESUR NEGATIVE 11/29/2018 1600   PROTEINUR NEGATIVE 11/29/2018 1600   NITRITE NEGATIVE 11/29/2018 1600   LEUKOCYTESUR NEGATIVE 11/29/2018 1600   Sepsis Labs Invalid input(s): PROCALCITONIN,  WBC,  LACTICIDVEN Microbiology Recent Results (from the past 240 hour(s))  Culture, blood (Routine X 2) w Reflex to ID Panel     Status: None   Collection Time: 11/22/18  4:40 PM   Specimen: BLOOD  Result Value Ref Range Status   Specimen Description BLOOD BLOOD LEFT FOREARM  Final   Special Requests   Final    BOTTLES DRAWN AEROBIC AND ANAEROBIC Blood Culture adequate volume   Culture   Final    NO GROWTH 5 DAYS Performed at [facility], [place] [address] [place], [place] [address]    Report Status 11/27/2018 FINAL  Final  Culture, blood (Routine X 2) w Reflex to ID Panel     Status: None   Collection Time: 11/22/18  4:40 PM   Specimen: BLOOD  Result Value Ref Range Status   Specimen Description BLOOD LEFT ANTECUBITAL  Final   Special Requests   Final    BOTTLES DRAWN AEROBIC AND ANAEROBIC Blood Culture adequate volume   Culture   Final    NO GROWTH 5 DAYS Performed at [facility], [place] [address] [place],  [address]    Report Status 11/27/2018 FINAL  Final  SARS Coronavirus 2 [facility] order, Performed in [facility] hospital lab) Nasopharyngeal Nasopharyngeal Swab     Status: None   Collection Time: 11/22/18  4:52 PM   Specimen: Nasopharyngeal Swab  Result Value Ref Range Status   SARS Coronavirus 2 NEGATIVE NEGATIVE Final    Comment: (NOTE) If result is NEGATIVE SARS-CoV-2 target nucleic acids are NOT DETECTED. The SARS-CoV-2 RNA is generally detectable in upper and lower  respiratory specimens during the acute phase of  infection. The lowest  concentration of SARS-CoV-2 viral copies this assay can detect is 250  copies / mL. A negative result does not preclude SARS-CoV-2 infection  and should not be used as the sole basis for treatment or other  patient management decisions.  A negative result may occur with  improper specimen collection / handling, submission of specimen other  than nasopharyngeal swab, presence of viral mutation(s) within the  areas targeted by this assay, and inadequate number of viral copies  (<250 copies / mL). A negative result must be combined with clinical  observations, patient history, and epidemiological information. If result is POSITIVE SARS-CoV-2 target nucleic acids are DETECTED. The SARS-CoV-2 RNA is generally detectable in upper and lower  respiratory specimens dur ing the acute phase of infection.  Positive  results are indicative of active infection with SARS-CoV-2.  Clinical  correlation with patient history and other diagnostic information is  necessary to determine patient infection status.  Positive results do  not rule out bacterial infection or co-infection with other viruses. If result is PRESUMPTIVE POSTIVE SARS-CoV-2 nucleic acids MAY BE PRESENT.  A presumptive positive result was obtained on the submitted specimen  and confirmed on repeat testing.  While 2019 novel coronavirus  (SARS-CoV-2) nucleic acids may be present in the submitted sample  additional confirmatory testing may be necessary for epidemiological  and / or clinical management purposes  to differentiate between  SARS-CoV-2 and other Sarbecovirus currently known to infect humans.  If clinically indicated additional testing with an alternate test  methodology [phone]) is advised. The SARS-CoV-2 RNA is generally  detectable in upper and lower respiratory sp ecimens during the acute  phase of infection. The expected result is Negative. Fact Sheet for Patients:   BoilerBrush.com.cy Fact Sheet for Healthcare Providers: https://pope.com/ This test is not yet approved or cleared by the [place] FDA and has been authorized for detection and/or diagnosis of SARS-CoV-2 by FDA under an Emergency Use Authorization (EUA).  This EUA will remain in effect (meaning this test can be used) for the duration of the COVID-19 declaration under Section 564(b)(1) of the Act, 21 U.S.C. section 360bbb-3(b)(1), unless the authorization is terminated or revoked sooner. Performed at [facility], [address] [place], [place] [address]   MRSA PCR Screening     Status: None   Collection Time: 11/22/18  9:59 PM   Specimen: Nasal Mucosa; Nasopharyngeal  Result Value Ref Range Status   MRSA by PCR NEGATIVE NEGATIVE Final    Comment:        The GeneXpert MRSA Assay (FDA approved for NASAL specimens only), is one component of a comprehensive MRSA colonization surveillance program. It is not intended to diagnose MRSA infection nor to guide or monitor treatment for MRSA infections. Performed at [facility], [address], [place], [place] [address]   Culture, respiratory (non-expectorated)     Status: None   Collection Time: 11/24/18  8:27 AM   Specimen: Tracheal Aspirate; Respiratory  Result Value Ref Range Status   Specimen Description TRACHEAL ASPIRATE  Final   Special Requests NONE  Final   Gram Stain   Final    RARE WBC PRESENT, PREDOMINANTLY MONONUCLEAR NO ORGANISMS SEEN Performed at [facility], [address], [place], [place] [address]    Culture RARE CANDIDA ALBICANS  Final   Report Status 11/27/2018 FINAL  Final     Time coordinating discharge: 32  minutes  SIGNED:   Kathlen Mody, MD  [facility] 12/01/2018, 2:42 PM Pager   If 7PM-7AM, please contact night-coverage [email or web address] Password [email or web address]

## 2018-12-01 NOTE — Progress Notes (Signed)
Progress Note  Patient Name: Marcia Saunders Date of Encounter: 12/01/2018  Primary Cardiologist: Dr Royann Shivers  Subjective   Slightly more alert today but remains somnolent; denies CP or dyspnea  Inpatient Medications    Scheduled Meds: . Chlorhexidine Gluconate Cloth  6 each Topical Daily  . feeding supplement  1 Container Oral TID BM  . ipratropium  0.5 mg Nebulization TID  . levalbuterol  0.63 mg Nebulization TID  . lidocaine  1 patch Transdermal Q24H  . mouth rinse  15 mL Mouth Rinse BID  . metolazone  5 mg Oral Once  . metoprolol tartrate  12.5 mg Oral BID  . pantoprazole (PROTONIX) IV  40 mg Intravenous Q12H  . rosuvastatin  10 mg Oral q1800   Continuous Infusions:  PRN Meds: acetaminophen, fentaNYL (SUBLIMAZE) injection, metoprolol tartrate, ondansetron (ZOFRAN) IV, phenol   Vital Signs    Vitals:   12/01/18 0429 12/01/18 0430 12/01/18 0750 12/01/18 0751  BP: (!) 150/80     Pulse: 63   65  Resp: 14   15  Temp:  98.2 F (36.8 C)    TempSrc:  Axillary    SpO2: 94%  100% 98%  Weight: 101.6 kg     Height:        Intake/Output Summary (Last 24 hours) at 12/01/2018 0800 Last data filed at 12/01/2018 2263 Gross per 24 hour  Intake 177 ml  Output 1875 ml  Net -1698 ml   Last 3 Weights 12/01/2018 11/30/2018 11/29/2018  Weight (lbs) 223 lb 15.8 oz 242 lb 12.8 oz 240 lb 8.4 oz  Weight (kg) 101.6 kg 110.133 kg 109.1 kg      Telemetry    Sinus with rare PVC- Personally Reviewed   Physical Exam   GEN: Slightly more alert; NAD Neck: supple, no JVD Cardiac: RRR Respiratory: CTA; no ronchi GI: Soft, NT/ND; no masses MS: 2-3+ edema. Neuro:  No focal findings  Labs    High Sensitivity Troponin:   Recent Labs  Lab 11/23/18 0007 11/23/18 0421 11/23/18 1255 11/23/18 1710 11/23/18 1933  TROPONINIHS 1,721* 2,378* 4,017* 3,844* 3,802*      Chemistry Recent Labs  Lab 11/29/18 0550 11/30/18 0513 11/30/18 1823 12/01/18 0402  NA 138 142  --  144  K  4.2 3.6  --  3.2*  CL 100 100  --  102  CO2 24 27  --  26  GLUCOSE 102* 114*  --  75  BUN 81* 83*  --  82*  CREATININE 3.64* 3.48*  --  3.22*  CALCIUM 8.5* 8.9  --  9.2  PROT  --   --  5.0*  --   ALBUMIN  --  2.0* 1.9* 2.0*  AST  --   --  28  --   ALT  --   --  46*  --   ALKPHOS  --   --  86  --   BILITOT  --   --  1.4*  --   GFRNONAA 11* 11*  --  12*  GFRAA 12* 13*  --  14*  ANIONGAP 14 15  --  16*     Hematology Recent Labs  Lab 11/28/18 0630 11/29/18 0550 11/30/18 0513 12/01/18 0402  WBC 8.5  --  8.9 8.7  RBC 2.80*  --  3.63* 3.85*  HGB 7.5* 10.6* 10.4* 10.8*  HCT 24.7* 32.1* 32.2* 34.3*  MCV 88.2  --  88.7 89.1  MCH 26.8  --  28.7 28.1  MCHC  30.4  --  32.3 31.5  RDW 19.9*  --  18.9* 19.6*  PLT 156  --  191 209    Patient Profile     85 y.o. 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   Assessment & Plan    1 demand non-ST elevation myocardial infarction-event occurred in the setting of hemoglobin of 4 and cardiac arrest/shock.  Echocardiogram shows no wall motion abnormalities.  No plans for further ischemia evaluation particularly in light of recent GI bleed.  Patient not on aspirin given recent GI bleed (initial hemoglobin 4).  Continue beta blocker and statin.  2 right heart failure/anasarca-diffuse anasarca remains.  This is felt to be multifactorial including right heart failure (OHS/OSA), acute renal failure and hypoalbuminemia. Patient remains volume overloaded.  Would resume lasix and follow.  3 acute renal insufficiency-Renal function unchanged today. Acute renal insufficiency is likely secondary to recent acute events (ATN from anemia-hemoglobin of 4 at time of admission- and post cardiac arrest).   Nephrology following.  4 acute blood loss anemia-hemoglobin 10.8 today.  Transfuse as needed.  Patient has been evaluated by gastroenterology with no plans for endoscopy.  5 lethargy-patient remains somewhat lethargic but slightly more responsive.  Head CT was negative.  Further evaluation per primary care.  For questions or updates, please contact [place] Please consult [email or web address] for contact info under        Signed, Kirk Ruths, MD  12/01/2018, 8:00 AM

## 2018-12-01 NOTE — Progress Notes (Signed)
Hypoglycemic Event  CBG:  59  Treatment: 9ml IV dextrose Symptoms: lethargy  Follow-up CBG: Time: 0443 CBG Result 76  Possible Reasons for Event: Low PO intake Comments/MD notified:will continue to [employer]

## 2018-12-01 NOTE — Progress Notes (Signed)
[place] NP notified as pt converted to a fib. VSS BP 139/77 HR 93 and asymptomatic at this time. EKG performed and [place] MD made aware. Will continue to monitor.

## 2018-12-01 NOTE — Consult Note (Signed)
Consultation Note Date: 12/01/2018   Patient Name: Marcia Saunders  DOB: 01-03-1931  MRN: [id number]  Age / Sex: 85 y.o., female  PCP: Patient, No Pcp Per Referring Physician: Hosie Poisson, MD  Reason for Consultation: Establishing goals of care and Psychosocial/spiritual support  HPI/Patient Profile: 85 y.o. female with an undocumented PMH but who per Epic notes this admission had a cardiac cath in 2009 that showed non obstructive cardiomyopathy who was admitted on 11/22/2018 with GIB and hemorrhagic shock and cardiac arrest.  Her hgb dropped to 4.0.  She required BiPAP.  Her hospitalization has been complicated by on-going hypercapnia, lethargy and acute renal failure.  Echo shows right sided heart failure.  Recent CBG this am was 59.  Clinical Assessment and Goals of Care:  I have reviewed medical records including EPIC notes, labs and imaging, received report from Dr. Karleen Hampshire, and assessed the patient.  I introduced Palliative Medicine as specialized medical care for people living with serious illness. It focuses on providing relief from the symptoms and stress of a serious illness. The goal is to improve quality of life for both the patient and the family.  We discussed a brief life review of the patient. [name] was very lethargic on my visit.  Per the RN she had done well off BiPAP first thing in the morning but then had become somnolent.  The patient is unable to have an indepth discussion with me.  She tells me she likes to be warm and she is cold.  She denies pain.   Primary Decision Maker:  NEXT OF KIN  Daughter Kimisha Eunice    SUMMARY OF RECOMMENDATIONS    PMT will follow with you.   [place] meeting with family is necessary as the patient is not decisional  Code Status/Advance Care Planning:  DNI   Symptom Management:   Needs BiPAP  Palliative Prophylaxis:   Delirium Protocol,  Frequent Pain Assessment and Turn Reposition  Psycho-social/Spiritual:   Desire for further Chaplaincy support:  Prognosis:   Prognosis is poor given age, right sided heart failure, worsening renal failure, recent cardiac arrest.  Discharge Planning: To Be Determined      Primary Diagnoses: Present on Admission: . Hemorrhagic shock ([place]) . Cardiac arrest ([place]) . Coagulopathy ([place])   I have reviewed the medical record, interviewed the patient and family, and examined the patient. The following aspects are pertinent.  History reviewed. No pertinent past medical history. Social History   Socioeconomic History  . Marital status: Widowed    Spouse name: Not on file  . Number of children: Not on file  . Years of education: Not on file  . Highest education level: Not on file  Occupational History  . Not on file  Social Needs  . Financial resource strain: Not on file  . Food insecurity    Worry: Not on file    Inability: Not on file  . Transportation needs    Medical: Not on file    Non-medical: Not on file  Tobacco  Use  . Smoking status: Current Some Day Smoker  . Smokeless tobacco: Current User  Substance and Sexual Activity  . Alcohol use: Never    Frequency: Never  . Drug use: Never  . Sexual activity: Not on file  Lifestyle  . Physical activity    Days per week: Not on file    Minutes per session: Not on file  . Stress: Not on file  Relationships  . Social Musician on phone: Not on file    Gets together: Not on file    Attends religious service: Not on file    Active member of club or organization: Not on file    Attends meetings of clubs or organizations: Not on file    Relationship status: Not on file  Other Topics Concern  . Not on file  Social History Narrative  . Not on file   History reviewed. No pertinent family history. Scheduled Meds: . Chlorhexidine Gluconate Cloth  6 each Topical Daily  . feeding supplement  1 Container Oral  TID BM  . furosemide  80 mg Intravenous BID  . ipratropium  0.5 mg Nebulization TID  . levalbuterol  0.63 mg Nebulization TID  . lidocaine  1 patch Transdermal Q24H  . mouth rinse  15 mL Mouth Rinse BID  . metolazone  5 mg Oral Once  . metoprolol tartrate  12.5 mg Oral BID  . pantoprazole (PROTONIX) IV  40 mg Intravenous Q12H  . rosuvastatin  10 mg Oral q1800   Continuous Infusions: PRN Meds:.acetaminophen, fentaNYL (SUBLIMAZE) injection, metoprolol tartrate, ondansetron (ZOFRAN) IV, phenol No Known Allergies Review of Systems patient unable to give  Physical Exam elderly pleasant lethargic female CV brady resp no distress on 2L [place]. Abdomen firm, obese, non-tender LE prevlon boots  Vital Signs: BP 135/82 (BP Location: Right Arm)   Pulse 62   Temp 98.4 F (36.9 C) (Axillary)   Resp 17   Ht 5\' 6"  (1.676 m)   Wt 101.6 kg   LMP  (LMP Unknown)   SpO2 96%   BMI 36.15 kg/m  Pain Scale: 0-10 POSS *See Group Information*: 1-Acceptable,Awake and alert Pain Score: 0-No pain   SpO2: SpO2: 96 % O2 Device:SpO2: 96 % O2 Flow Rate: .O2 Flow Rate (L/min): 2 L/min  IO: Intake/output summary:   Intake/Output Summary (Last 24 hours) at 12/01/2018 1256 Last data filed at 12/01/2018 1200 Gross per 24 hour  Intake 399 ml  Output 1875 ml  Net -1476 ml    LBM: Last BM Date: 11/28/18 Baseline Weight: Weight: 117.9 kg Most recent weight: Weight: 101.6 kg     Palliative Assessment/Data: 20%     Time In: 10:00 Time Out: 10:50 Time Total: 50 min Visit consisted of counseling and education dealing with the complex and emotionally intense issues surrounding the need for palliative care and symptom management in the setting of serious and potentially life-threatening illness. Greater than 50%  of this time was spent counseling and coordinating care related to the above assessment and plan.  Signed by: 01/28/19, PA-C Palliative Medicine Pager: [phone]  Please contact  Palliative Medicine Team phone at [phone] for questions and concerns.  For individual provider: See [phone]

## 2018-12-01 NOTE — Progress Notes (Signed)
Called by nurse, pt went into a fib rate controlled around 1 pm today, tele strips are not available.  She was admitted with hemorrhagic shock probably related to lower GI bleed.  Hgb 4.  On admit there was question of a fib but Dr. Sallyanne Kuster thought it was sinus with irregular rhythm.  Will discuss with DOD but most likely not a candidate for anticoagulation.  HR is controlled.

## 2018-12-01 NOTE — Progress Notes (Signed)
Pt's daughter at bedside- CM aware. Report given to Andee Poles ([facility] Therapist, sports). Will continue to monitor.

## 2018-12-01 NOTE — TOC Transition Note (Addendum)
Transition of Care [facility]) - CM/SW Discharge Note   Patient Details  Name: Marcia Saunders MRN: [id number] Date of Birth: 09/06/1930  Transition of Care [facility]) CM/SW Contact:  Bartholomew Crews, RN Phone Number: [phone] 12/01/2018, 2:51 PM   Clinical Narrative:    Discussed with [facility] liaison and MD about patient transition to [place] today. Nephrology cleared. Spoke with daughter to discuss transition - approved. Will notify [facility] for transport when patient ready.   Update: Patient will go to [id number]. Accepting physician is Dr. Bernadette Hoit. The nurse can call report to   #[phone] #[phone] #[phone]  Final next level of care: [place] (LTAC) Barriers to Discharge: No Barriers Identified   Patient Goals and CMS Choice Patient states their goals for this hospitalization and ongoing recovery are:: long term acute [facility] at [email or web address].gov Compare Post Acute Care list provided to:: Patient Represenative (must comment) Choice offered to / list presented to : Adult Children  Discharge Placement              Patient chooses bed at: [facility] Patient to be transferred to facility by: [place] Name of family member notified: [name] Patient and family notified of of transfer: 12/01/18  Discharge Plan and Services In-house Referral: NA   Post Acute Care Choice: [place]          DME Arranged: N/A DME Agency: NA       HH Arranged: NA HH Agency: NA        Social Determinants of Health (SDOH) Interventions     Readmission Risk Interventions No flowsheet data found.

## 2018-12-04 DIAGNOSIS — K922 Gastrointestinal hemorrhage, unspecified: Secondary | ICD-10-CM

## 2018-12-04 DIAGNOSIS — N179 Acute kidney failure, unspecified: Secondary | ICD-10-CM

## 2018-12-04 DIAGNOSIS — G9340 Encephalopathy, unspecified: Secondary | ICD-10-CM

## 2018-12-04 DIAGNOSIS — J9621 Acute and chronic respiratory failure with hypoxia: Secondary | ICD-10-CM

## 2018-12-05 DIAGNOSIS — K922 Gastrointestinal hemorrhage, unspecified: Secondary | ICD-10-CM

## 2018-12-05 DIAGNOSIS — G9340 Encephalopathy, unspecified: Secondary | ICD-10-CM

## 2018-12-05 DIAGNOSIS — N179 Acute kidney failure, unspecified: Secondary | ICD-10-CM

## 2018-12-05 DIAGNOSIS — J9621 Acute and chronic respiratory failure with hypoxia: Secondary | ICD-10-CM

## 2018-12-06 DIAGNOSIS — K922 Gastrointestinal hemorrhage, unspecified: Secondary | ICD-10-CM

## 2018-12-06 DIAGNOSIS — J9621 Acute and chronic respiratory failure with hypoxia: Secondary | ICD-10-CM

## 2018-12-06 DIAGNOSIS — G9340 Encephalopathy, unspecified: Secondary | ICD-10-CM

## 2018-12-06 DIAGNOSIS — N179 Acute kidney failure, unspecified: Secondary | ICD-10-CM

## 2018-12-07 DIAGNOSIS — G9340 Encephalopathy, unspecified: Secondary | ICD-10-CM

## 2018-12-07 DIAGNOSIS — K922 Gastrointestinal hemorrhage, unspecified: Secondary | ICD-10-CM

## 2018-12-07 DIAGNOSIS — N179 Acute kidney failure, unspecified: Secondary | ICD-10-CM

## 2018-12-07 DIAGNOSIS — J9621 Acute and chronic respiratory failure with hypoxia: Secondary | ICD-10-CM

## 2018-12-08 DIAGNOSIS — G9340 Encephalopathy, unspecified: Secondary | ICD-10-CM

## 2018-12-08 DIAGNOSIS — N179 Acute kidney failure, unspecified: Secondary | ICD-10-CM

## 2018-12-08 DIAGNOSIS — K922 Gastrointestinal hemorrhage, unspecified: Secondary | ICD-10-CM

## 2018-12-08 DIAGNOSIS — J9621 Acute and chronic respiratory failure with hypoxia: Secondary | ICD-10-CM

## 2018-12-09 DIAGNOSIS — N179 Acute kidney failure, unspecified: Secondary | ICD-10-CM

## 2018-12-09 DIAGNOSIS — G9340 Encephalopathy, unspecified: Secondary | ICD-10-CM

## 2018-12-09 DIAGNOSIS — K922 Gastrointestinal hemorrhage, unspecified: Secondary | ICD-10-CM

## 2018-12-09 DIAGNOSIS — J9621 Acute and chronic respiratory failure with hypoxia: Secondary | ICD-10-CM

## 2018-12-10 DIAGNOSIS — N179 Acute kidney failure, unspecified: Secondary | ICD-10-CM | POA: Diagnosis not present

## 2018-12-10 DIAGNOSIS — G9340 Encephalopathy, unspecified: Secondary | ICD-10-CM | POA: Diagnosis not present

## 2018-12-10 DIAGNOSIS — K922 Gastrointestinal hemorrhage, unspecified: Secondary | ICD-10-CM

## 2018-12-10 DIAGNOSIS — J9621 Acute and chronic respiratory failure with hypoxia: Secondary | ICD-10-CM

## 2018-12-18 DIAGNOSIS — N179 Acute kidney failure, unspecified: Secondary | ICD-10-CM

## 2018-12-18 DIAGNOSIS — J9621 Acute and chronic respiratory failure with hypoxia: Secondary | ICD-10-CM

## 2018-12-18 DIAGNOSIS — G9340 Encephalopathy, unspecified: Secondary | ICD-10-CM

## 2018-12-18 DIAGNOSIS — K922 Gastrointestinal hemorrhage, unspecified: Secondary | ICD-10-CM

## 2018-12-19 DIAGNOSIS — K922 Gastrointestinal hemorrhage, unspecified: Secondary | ICD-10-CM

## 2018-12-19 DIAGNOSIS — J9621 Acute and chronic respiratory failure with hypoxia: Secondary | ICD-10-CM

## 2018-12-19 DIAGNOSIS — N179 Acute kidney failure, unspecified: Secondary | ICD-10-CM

## 2018-12-19 DIAGNOSIS — G9340 Encephalopathy, unspecified: Secondary | ICD-10-CM

## 2019-03-26 DEATH — deceased

## 2020-07-31 IMAGING — CT CT HEAD W/O CM
4 of 5 series · 17 of 47 positions shown, 18 images · non-contrast
Comparison: None.

CLINICAL DATA: Encephalopathy

EXAM:
CT HEAD WITHOUT CONTRAST
TECHNIQUE: Contiguous axial images were obtained from the base of the skull
through the vertex without intravenous contrast.

[Series 3: head bone · axial · 0.46mm/px · z∈[-135,+5]mm · 8 of 85 slices shown]
[im 8/85  bone]
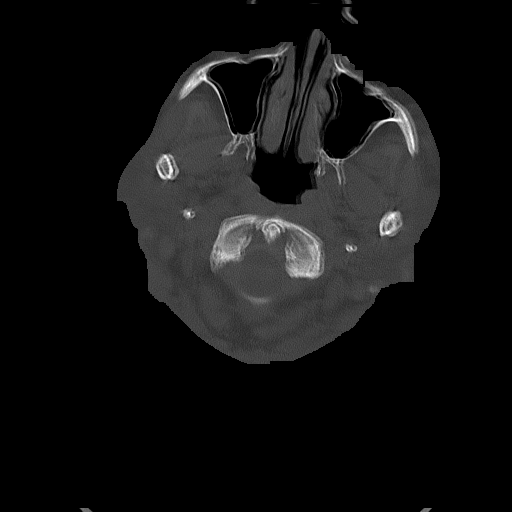
[im 22/85  bone]
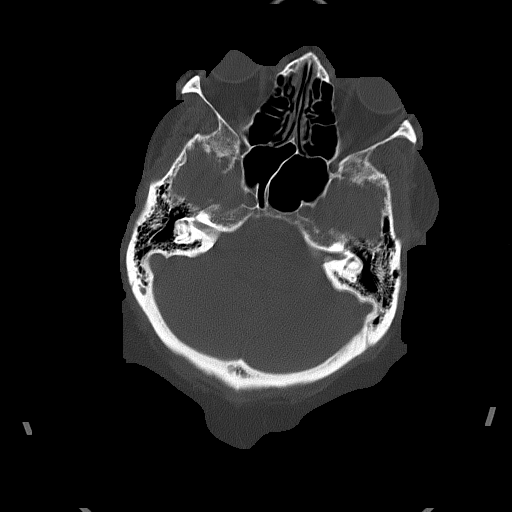
[im 29/85  bone]
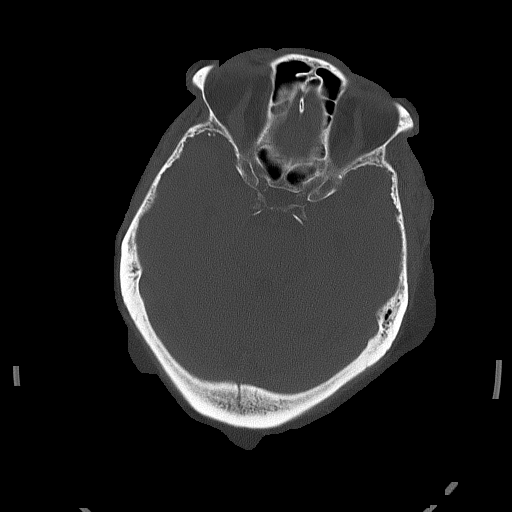
[im 36/85  bone]
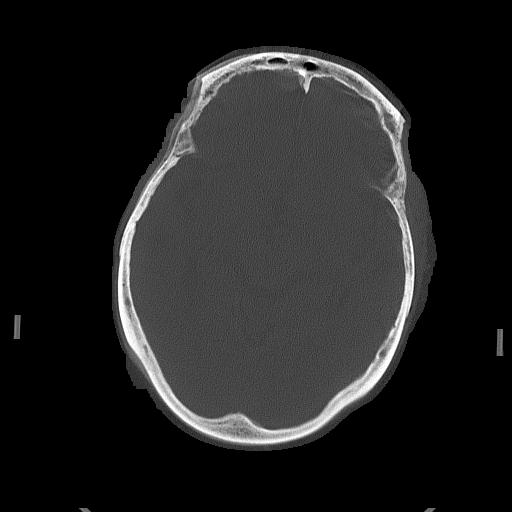
[im 50/85  bone]
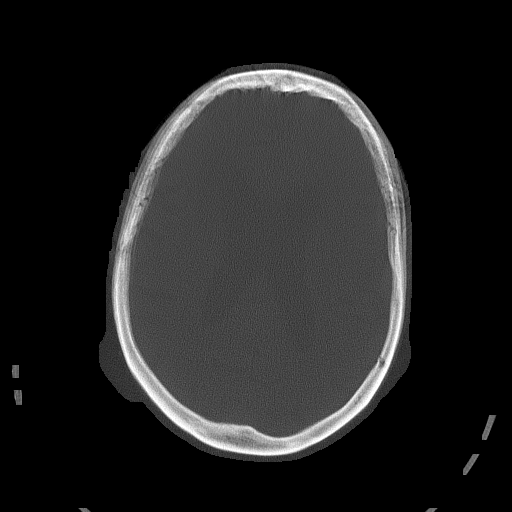
[im 57/85  bone]
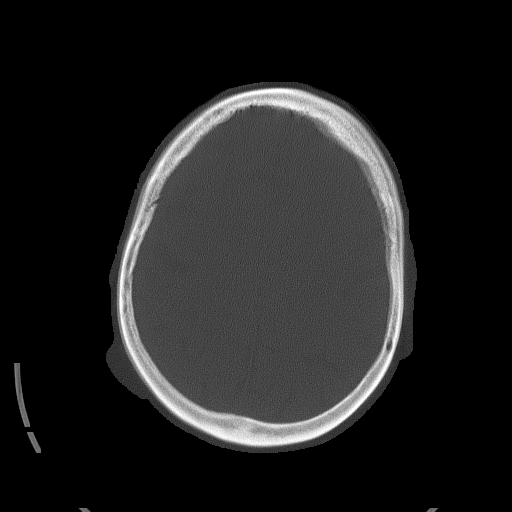
[im 64/85  bone]
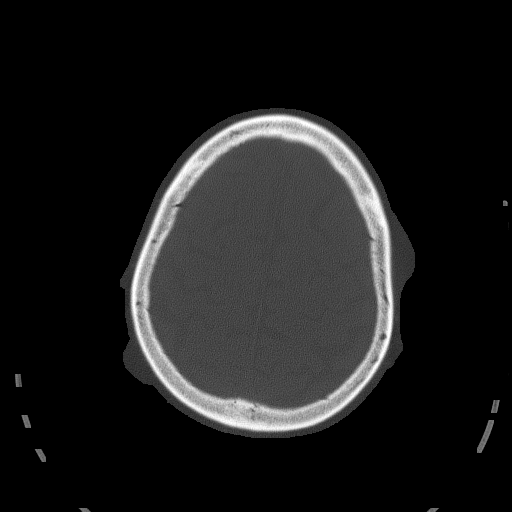
[im 78/85  bone]
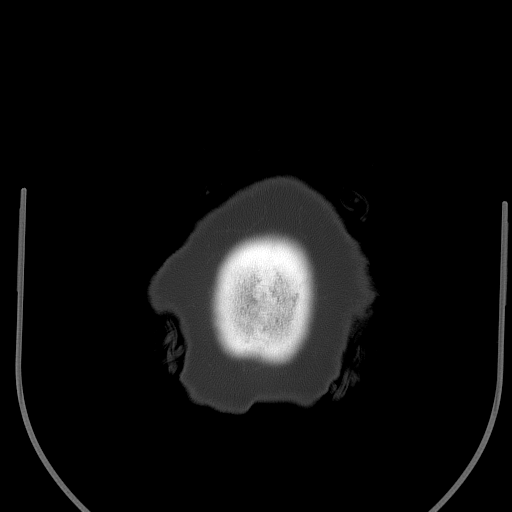

[Series 5: head without · axial · non-contrast · 0.46mm/px · z∈[-109,-29]mm · 3 of 34 slices shown, 4 images]
[im 9/34  brain]
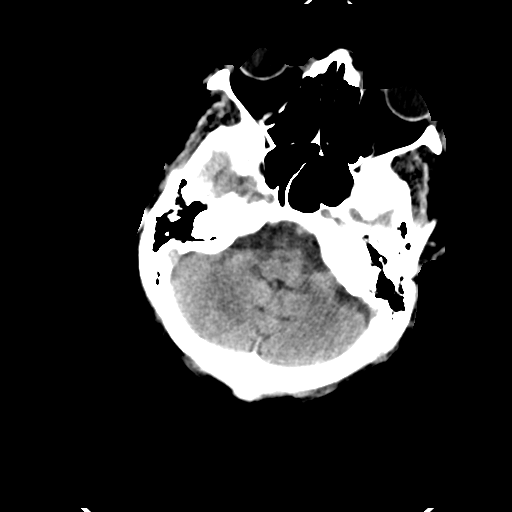
[im 9/34  bone]
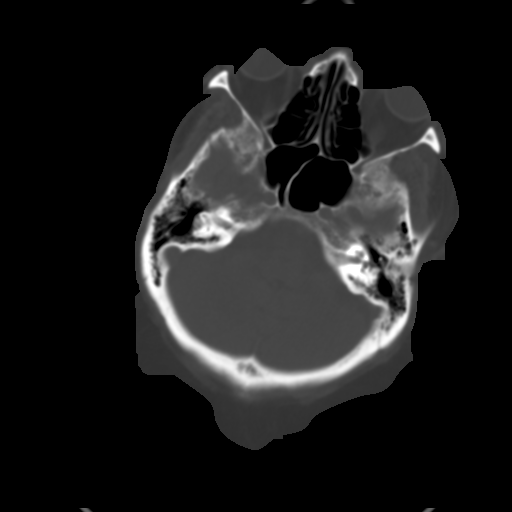
[im 17/34  brain]
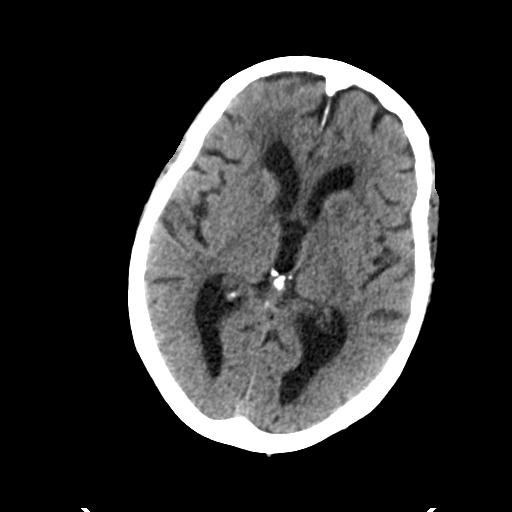
[im 25/34  brain]
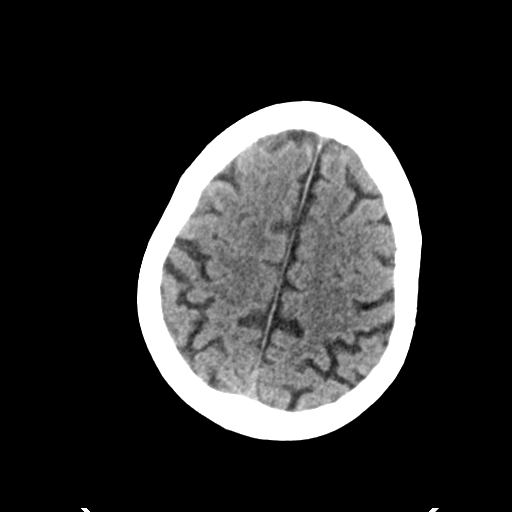

[Series 7: head without cor · coronal · non-contrast · 0.33mm/px · 3 of 67 slices shown]
[im 23/67  brain]
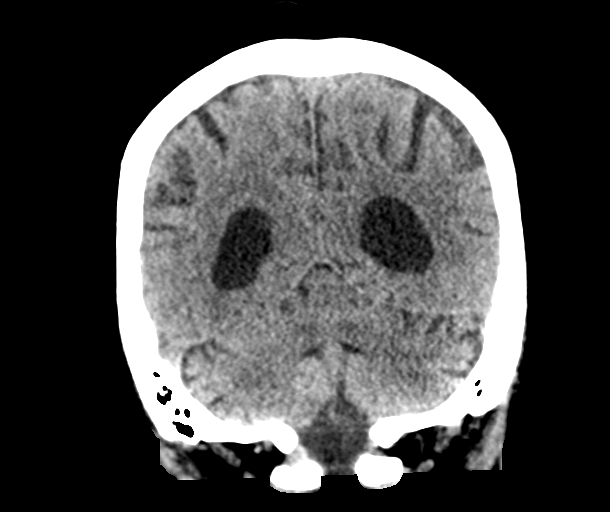
[im 30/67  brain]
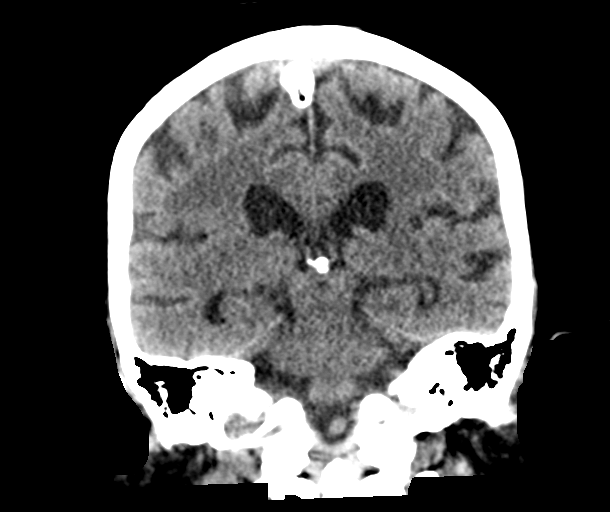
[im 37/67  brain]
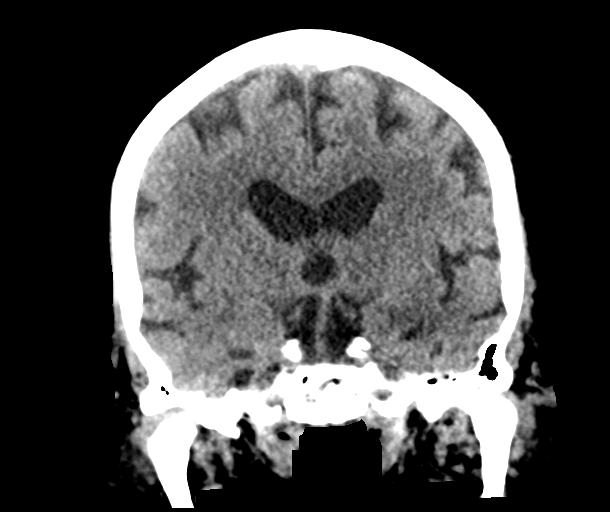

[Series 8: head without sag · sagittal · non-contrast · 0.32mm/px · 3 of 56 slices shown]
[im 19/56  brain]
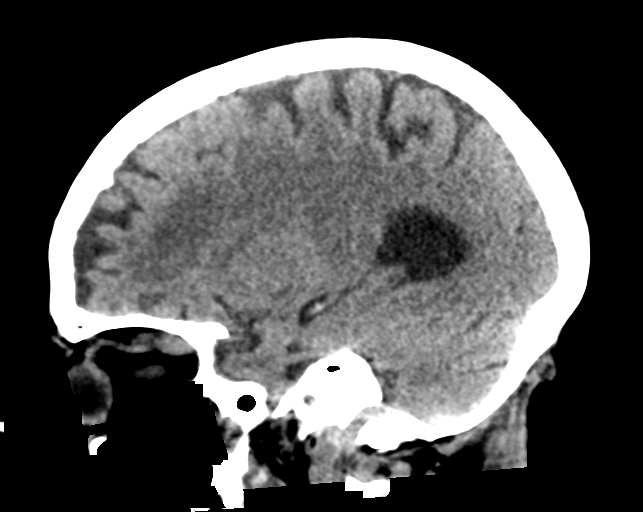
[im 28/56  brain]
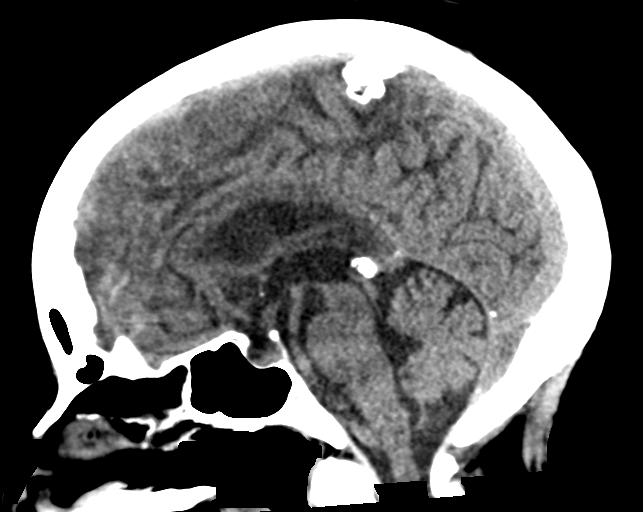
[im 37/56  brain]
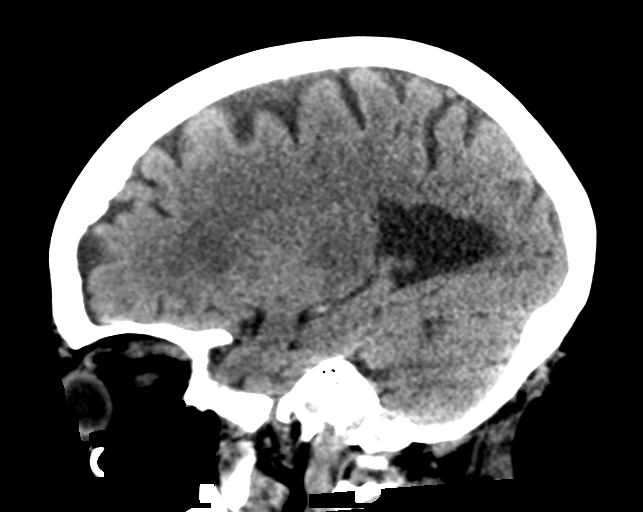

[17 of 47 positions shown; findings below may reference images not displayed]

FINDINGS: Brain: There is no mass, hemorrhage or extra-axial collection. There
is generalized atrophy without lobar predilection. Hypodensity of
the white matter is most commonly associated with chronic
microvascular disease.

Vascular: No abnormal hyperdensity of the major intracranial
arteries or dural venous sinuses. No intracranial atherosclerosis.

Skull: The visualized skull base, calvarium and extracranial soft
tissues are normal.

Sinuses/Orbits: No fluid levels or advanced mucosal thickening of
the visualized paranasal sinuses. No mastoid or middle ear effusion.
The orbits are normal.
IMPRESSION: Generalized atrophy and chronic microvascular ischemia without acute
intracranial abnormality.
